# Patient Record
Sex: Male | Born: 1980 | Race: White | Hispanic: No | Marital: Single | Smoking: Never smoker
Health system: Southern US, Community
[De-identification: ages and names within clinical notes are randomized; demographics above are authoritative.]

## PROBLEM LIST (undated history)

## (undated) DIAGNOSIS — I10 Essential (primary) hypertension: Secondary | ICD-10-CM

---

## 2012-09-15 ENCOUNTER — Ambulatory Visit (INDEPENDENT_AMBULATORY_CARE_PROVIDER_SITE_OTHER): Payer: 59

## 2012-09-15 ENCOUNTER — Ambulatory Visit (INDEPENDENT_AMBULATORY_CARE_PROVIDER_SITE_OTHER): Payer: 59 | Admitting: Internal Medicine

## 2012-09-15 ENCOUNTER — Encounter: Payer: Self-pay | Admitting: Internal Medicine

## 2012-09-15 VITALS — BP 126/84 | HR 65 | Temp 98.3°F | Wt 176.0 lb

## 2012-09-15 DIAGNOSIS — Z13220 Encounter for screening for lipoid disorders: Secondary | ICD-10-CM

## 2012-09-15 DIAGNOSIS — Z130 Encounter for screening for diseases of the blood and blood-forming organs and certain disorders involving the immune mechanism: Secondary | ICD-10-CM

## 2012-09-15 DIAGNOSIS — Z131 Encounter for screening for diabetes mellitus: Secondary | ICD-10-CM

## 2012-09-15 DIAGNOSIS — Z0000 Encounter for general adult medical examination without abnormal findings: Secondary | ICD-10-CM

## 2012-09-15 DIAGNOSIS — R7989 Other specified abnormal findings of blood chemistry: Secondary | ICD-10-CM

## 2012-09-15 DIAGNOSIS — D234 Other benign neoplasm of skin of scalp and neck: Secondary | ICD-10-CM

## 2012-09-15 LAB — CBC
Hemoglobin: 16.7 g/dL (ref 13.0–17.0)
MCHC: 34.5 g/dL (ref 30.0–36.0)
RDW: 12.9 % (ref 11.5–14.6)
WBC: 8 10*3/uL (ref 4.5–10.5)

## 2012-09-15 LAB — HEMOGLOBIN A1C: Hgb A1c MFr Bld: 5.3 % (ref 4.6–6.5)

## 2012-09-15 NOTE — Progress Notes (Signed)
HPI  Pt presents to the clinic today to establish care. He does not have a prior PCP. He does have some concern today about a cyst that has been on his head for the last 1-2 years. It does not hurt. It does not seem to be growing. He is ready to have it taken out.  Flu: yearly Tetanus: UTD, but does not know year Dentist: as needed  History reviewed. No pertinent past medical history.  No current outpatient prescriptions on file.   No current facility-administered medications for this visit.    Not on File  History reviewed. No pertinent family history.  History   Social History  . Marital Status: Single    Spouse Name: N/A    Number of Children: N/A  . Years of Education: N/A   Occupational History  . Not on file.   Social History Main Topics  . Smoking status: Not on file  . Smokeless tobacco: Not on file  . Alcohol Use: Not on file  . Drug Use: Not on file  . Sexual Activity: Not on file   Other Topics Concern  . Not on file   Social History Narrative  . No narrative on file    ROS:  Constitutional: Denies fever, malaise, fatigue, headache or abrupt weight changes.  HEENT: Denies eye pain, eye redness, ear pain, ringing in the ears, wax buildup, runny nose, nasal congestion, bloody nose, or sore throat. Respiratory: Denies difficulty breathing, shortness of breath, cough or sputum production.   Cardiovascular: Denies chest pain, chest tightness, palpitations or swelling in the hands or feet.  Gastrointestinal: Denies abdominal pain, bloating, constipation, diarrhea or blood in the stool.  GU: Denies frequency, urgency, pain with urination, blood in urine, odor or discharge. Musculoskeletal: Denies decrease in range of motion, difficulty with gait, muscle pain or joint pain and swelling.  Skin: Pt reports cyst on top of head. Denies redness, rashes, lesions or ulcercations.  Neurological: Denies dizziness, difficulty with memory, difficulty with speech or  problems with balance and coordination.   No other specific complaints in a complete review of systems (except as listed in HPI above).  PE:  BP 126/84  Pulse 65  Temp(Src) 98.3 F (36.8 C) (Oral)  Wt 176 lb (79.833 kg)  SpO2 96% Wt Readings from Last 3 Encounters:  09/15/12 176 lb (79.833 kg)    General: Appears his stated age, well developed, well nourished in NAD. Skin: small dime sized cyst on top of heat, no erythema, tenderness or drainage. HEENT: Head: normal shape and size; Eyes: sclera white, no icterus, conjunctiva pink, PERRLA and EOMs intact; Ears: Tm's gray and intact, normal light reflex; Nose: mucosa pink and moist, septum midline; Throat/Mouth: Teeth present, mucosa pink and moist, no lesions or ulcerations noted.  Neck: Normal range of motion. Neck supple, trachea midline. No massses, lumps or thyromegaly present.  Cardiovascular: Normal rate and rhythm. S1,S2 noted.  No murmur, rubs or gallops noted. No JVD or BLE edema. No carotid bruits noted. Pulmonary/Chest: Normal effort and positive vesicular breath sounds. No respiratory distress. No wheezes, rales or ronchi noted.  Abdomen: Soft and nontender. Normal bowel sounds, no bruits noted. No distention or masses noted. Liver, spleen and kidneys non palpable. Musculoskeletal: Normal range of motion. No signs of joint swelling. No difficulty with gait.  Neurological: Alert and oriented. Cranial nerves II-XII intact. Coordination normal. +DTRs bilaterally. Psychiatric: Mood and affect normal. Behavior is normal. Judgment and thought content normal.  Assessment and Plan:  Health Maintenance:  All HM UTD Encouraged pt to visit dentist yearly Will check screening labs today

## 2012-09-15 NOTE — Patient Instructions (Signed)
Health Maintenance, Males A healthy lifestyle and preventative care can promote health and wellness.  Maintain regular health, dental, and eye exams.  Eat a healthy diet. Foods like vegetables, fruits, whole grains, low-fat dairy products, and lean protein foods contain the nutrients you need without too many calories. Decrease your intake of foods high in solid fats, added sugars, and salt. Get information about a proper diet from your caregiver, if necessary.  Regular physical exercise is one of the most important things you can do for your health. Most adults should get at least 150 minutes of moderate-intensity exercise (any activity that increases your heart rate and causes you to sweat) each week. In addition, most adults need muscle-strengthening exercises on 2 or more days a week.   Maintain a healthy weight. The body mass index (BMI) is a screening tool to identify possible weight problems. It provides an estimate of body fat based on height and weight. Your caregiver can help determine your BMI, and can help you achieve or maintain a healthy weight. For adults 20 years and older:  A BMI below 18.5 is considered underweight.  A BMI of 18.5 to 24.9 is normal.  A BMI of 25 to 29.9 is considered overweight.  A BMI of 30 and above is considered obese.  Maintain normal blood lipids and cholesterol by exercising and minimizing your intake of saturated fat. Eat a balanced diet with plenty of fruits and vegetables. Blood tests for lipids and cholesterol should begin at age 20 and be repeated every 5 years. If your lipid or cholesterol levels are high, you are over 50, or you are a high risk for heart disease, you may need your cholesterol levels checked more frequently.Ongoing high lipid and cholesterol levels should be treated with medicines, if diet and exercise are not effective.  If you smoke, find out from your caregiver how to quit. If you do not use tobacco, do not start.  If you  choose to drink alcohol, do not exceed 2 drinks per day. One drink is considered to be 12 ounces (355 mL) of beer, 5 ounces (148 mL) of wine, or 1.5 ounces (44 mL) of liquor.  Avoid use of street drugs. Do not share needles with anyone. Ask for help if you need support or instructions about stopping the use of drugs.  High blood pressure causes heart disease and increases the risk of stroke. Blood pressure should be checked at least every 1 to 2 years. Ongoing high blood pressure should be treated with medicines if weight loss and exercise are not effective.  If you are 45 to 32 years old, ask your caregiver if you should take aspirin to prevent heart disease.  Diabetes screening involves taking a blood sample to check your fasting blood sugar level. This should be done once every 3 years, after age 45, if you are within normal weight and without risk factors for diabetes. Testing should be considered at a younger age or be carried out more frequently if you are overweight and have at least 1 risk factor for diabetes.  Colorectal cancer can be detected and often prevented. Most routine colorectal cancer screening begins at the age of 50 and continues through age 75. However, your caregiver may recommend screening at an earlier age if you have risk factors for colon cancer. On a yearly basis, your caregiver may provide home test kits to check for hidden blood in the stool. Use of a small camera at the end of a tube,   to directly examine the colon (sigmoidoscopy or colonoscopy), can detect the earliest forms of colorectal cancer. Talk to your caregiver about this at age 50, when routine screening begins. Direct examination of the colon should be repeated every 5 to 10 years through age 75, unless early forms of pre-cancerous polyps or small growths are found.  Hepatitis C blood testing is recommended for all people born from 1945 through 1965 and any individual with known risks for hepatitis C.  Healthy  men should no longer receive prostate-specific antigen (PSA) blood tests as part of routine cancer screening. Consult with your caregiver about prostate cancer screening.  Testicular cancer screening is not recommended for adolescents or adult males who have no symptoms. Screening includes self-exam, caregiver exam, and other screening tests. Consult with your caregiver about any symptoms you have or any concerns you have about testicular cancer.  Practice safe sex. Use condoms and avoid high-risk sexual practices to reduce the spread of sexually transmitted infections (STIs).  Use sunscreen with a sun protection factor (SPF) of 30 or greater. Apply sunscreen liberally and repeatedly throughout the day. You should seek shade when your shadow is shorter than you. Protect yourself by wearing long sleeves, pants, a wide-brimmed hat, and sunglasses year round, whenever you are outdoors.  Notify your caregiver of new moles or changes in moles, especially if there is a change in shape or color. Also notify your caregiver if a mole is larger than the size of a pencil eraser.  A one-time screening for abdominal aortic aneurysm (AAA) and surgical repair of large AAAs by sound wave imaging (ultrasonography) is recommended for ages 65 to 75 years who are current or former smokers.  Stay current with your immunizations. Document Released: 07/05/2007 Document Revised: 03/31/2011 Document Reviewed: 06/03/2010 [employer] Patient Information 2014 [employer].  

## 2012-09-16 LAB — COMPREHENSIVE METABOLIC PANEL
ALT: 38 U/L (ref 0–53)
AST: 25 U/L (ref 0–37)
Albumin: 4.8 g/dL (ref 3.5–5.2)
CO2: 31 mEq/L (ref 19–32)
Calcium: 9.7 mg/dL (ref 8.4–10.5)
Chloride: 99 mEq/L (ref 96–112)
GFR: 87.7 mL/min (ref >60.00)
Potassium: 4.1 mEq/L (ref 3.5–5.1)
Sodium: 137 mEq/L (ref 135–145)
Total Protein: 8.3 g/dL (ref 6.0–8.3)

## 2012-09-16 LAB — LIPID PANEL: HDL: 39 mg/dL — ABNORMAL LOW (ref >39.00)

## 2012-09-17 ENCOUNTER — Telehealth: Payer: Self-pay

## 2012-09-17 LAB — LDL CHOLESTEROL, DIRECT: Direct LDL: 170.5 mg/dL

## 2012-09-17 NOTE — Telephone Encounter (Signed)
Could not get through to patient's number listed so mailed his results with Regina's comments.

## 2012-09-17 NOTE — Telephone Encounter (Signed)
Message copied by Noreene Larsson on Fri Sep 17, 2012 11:49 AM ------      Message from: Lorre Munroe      Created: Fri Sep 17, 2012 11:31 AM       Please call pt and let him know his cholesterol and triglycerides are very elevated. He needs to try 3 months of lifestyle therapy which includes avoiding fried and fatty foods in addition to aerobic exercise 3 days a week. Make a fu appt for 3 months to recheck lipids. If still elevated at that time, we will need to start cholesterol medication. All other labs are normal. ------

## 2012-09-24 ENCOUNTER — Ambulatory Visit (INDEPENDENT_AMBULATORY_CARE_PROVIDER_SITE_OTHER): Payer: 59 | Admitting: Surgery

## 2012-10-04 ENCOUNTER — Encounter (INDEPENDENT_AMBULATORY_CARE_PROVIDER_SITE_OTHER): Payer: Self-pay | Admitting: Surgery

## 2012-10-04 ENCOUNTER — Ambulatory Visit (INDEPENDENT_AMBULATORY_CARE_PROVIDER_SITE_OTHER): Payer: Commercial Managed Care - PPO | Admitting: Surgery

## 2012-10-04 ENCOUNTER — Telehealth (INDEPENDENT_AMBULATORY_CARE_PROVIDER_SITE_OTHER): Payer: Self-pay | Admitting: Surgery

## 2012-10-04 VITALS — BP 144/68 | HR 84 | Temp 98.2°F | Resp 16 | Ht 69.0 in | Wt 175.0 lb

## 2012-10-04 DIAGNOSIS — L723 Sebaceous cyst: Secondary | ICD-10-CM

## 2012-10-04 NOTE — Patient Instructions (Addendum)

## 2012-10-04 NOTE — Progress Notes (Signed)
Patient ID: Willie [name], male   DOB: Jul 13, 1980, 32 y.o.   MRN: [id number]  Chief Complaint  Patient presents with  . New Evaluation    eval cyst on scalp    HPI Willie [name] is a 32 y.o. male.  Patient sent at the request of Nicki Reaper NP 4 cyst on scalp. It has been present for 2 years. He is getting larger. Causes mild discomfort when squeezed. No redness or drainage. HPI  History reviewed. No pertinent past medical history.  History reviewed. No pertinent past surgical history.  Family History  Problem Relation Age of Onset  . Diabetes Father   . Cancer Neg Hx   . Heart disease Neg Hx   . Stroke Neg Hx     Social History History  Substance Use Topics  . Smoking status: Never Smoker   . Smokeless tobacco: Never Used  . Alcohol Use: 0.6 oz/week    1 Shots of liquor per week     Comment: rare    No Known Allergies  No current outpatient prescriptions on file.   No current facility-administered medications for this visit.    Review of Systems Review of Systems  Constitutional: Negative for fever, chills and unexpected weight change.  HENT: Negative for hearing loss, congestion, sore throat, trouble swallowing and voice change.   Eyes: Negative for visual disturbance.  Respiratory: Negative for cough and wheezing.   Cardiovascular: Negative for chest pain, palpitations and leg swelling.  Gastrointestinal: Negative for nausea, vomiting, abdominal pain, diarrhea, constipation, blood in stool, abdominal distention, anal bleeding and rectal pain.  Genitourinary: Negative for hematuria and difficulty urinating.  Musculoskeletal: Negative for arthralgias.  Skin: Negative for rash and wound.  Neurological: Negative for seizures, syncope, weakness and headaches.  Hematological: Negative for adenopathy. Does not bruise/bleed easily.  Psychiatric/Behavioral: Negative for confusion.    Blood pressure 144/68, pulse 84, temperature 98.2 F (36.8 C), temperature  source Oral, resp. rate 16, height 5\' 9"  (1.753 m), weight 175 lb (79.379 kg).  Physical Exam Physical Exam  Constitutional: He is oriented to person, place, and time. He appears well-developed and well-nourished.  HENT:  Head: Atraumatic.    Eyes: EOM are normal. Pupils are equal, round, and reactive to light.  Neck: Normal range of motion. Neck supple.  Cardiovascular: Normal rate and regular rhythm.   Musculoskeletal: Normal range of motion.  Neurological: He is alert and oriented to person, place, and time.  Skin: Skin is warm and dry.  Psychiatric: He has a normal mood and affect. His behavior is normal. Judgment and thought content normal.      Assessment    2 cm x 2 cm sebaceous cyst anterior scalp    Plan    Patient wishes to proceed with excision of epidermal inclusion cyst.The procedure has been discussed with the patient.  Alternative therapies have been discussed with the patient.  Operative risks include bleeding,  Infection,  Organ injury,  Nerve injury,  Blood vessel injury,  DVT,  Pulmonary embolism,  Death,  And possible reoperation.  Medical management risks include worsening of present situation.  The success of the procedure is 50 -90 % at treating patients symptoms.  The patient understands and agrees to proceed.       Cale Decarolis A. 10/04/2012, 9:35 AM

## 2012-10-04 NOTE — Telephone Encounter (Signed)
Pt made aware of financial obligation will call back if and when for sx scheduling aware orders 90 days

## 2012-11-25 ENCOUNTER — Other Ambulatory Visit: Payer: Self-pay

## 2013-06-25 ENCOUNTER — Emergency Department (HOSPITAL_COMMUNITY)
Admission: EM | Admit: 2013-06-25 | Discharge: 2013-06-25 | Disposition: A | Discharge status: Home / Self Care | Payer: 59 | Source: Home / Self Care

## 2013-06-25 ENCOUNTER — Encounter (HOSPITAL_COMMUNITY): Payer: Self-pay | Admitting: Emergency Medicine

## 2013-06-25 DIAGNOSIS — J309 Allergic rhinitis, unspecified: Secondary | ICD-10-CM

## 2013-06-25 DIAGNOSIS — J040 Acute laryngitis: Secondary | ICD-10-CM

## 2013-06-25 DIAGNOSIS — R0982 Postnasal drip: Secondary | ICD-10-CM

## 2013-06-25 LAB — POCT RAPID STREP A: Streptococcus, Group A Screen (Direct): NEGATIVE

## 2013-06-25 MED ORDER — PREDNISONE 20 MG PO TABS
ORAL_TABLET | ORAL | Status: DC
Start: 2013-06-25 — End: 2013-07-05

## 2013-06-25 NOTE — Discharge Instructions (Signed)
Laryngitis Allegra 180 mg Lots of fluids flonase nasal spray At the top of your windpipe is your voice box. It is the source of your voice. Inside your voice box are 2 bands of muscles called vocal cords. When you breathe, your vocal cords are relaxed and open so that air can get into the lungs. When you decide to say something, these cords come together and vibrate. The sound from these vibrations goes into your throat and comes out through your mouth as sound. Laryngitis is an inflammation of the vocal cords that causes hoarseness, cough, loss of voice, sore throat, and dry throat. Laryngitis can be temporary (acute) or long-term (chronic). Most cases of acute laryngitis improve with time.Chronic laryngitis lasts for more than 3 weeks. CAUSES Laryngitis can often be related to excessive smoking, talking, or yelling, as well as inhalation of toxic fumes and allergies. Acute laryngitis is usually caused by a viral infection, vocal strain, measles or mumps, or bacterial infections. Chronic laryngitis is usually caused by vocal cord strain, vocal cord injury, postnasal drip, growths on the vocal cords, or acid reflux. SYMPTOMS   Cough.  Sore throat.  Dry throat. RISK FACTORS  Respiratory infections.  Exposure to irritating substances, such as cigarette smoke, excessive amounts of alcohol, stomach acids, and workplace chemicals.  Voice trauma, such as vocal cord injury from shouting or speaking too loud. DIAGNOSIS  Your cargiver will perform a physical exam. During the physical exam, your caregiver will examine your throat. The most common sign of laryngitis is hoarseness. Laryngoscopy may be necessary to confirm the diagnosis of this condition. This procedure allows your caregiver to look into the larynx. HOME CARE INSTRUCTIONS  Drink enough fluids to keep your urine clear or pale yellow.  Rest until you no longer have symptoms or as directed by your caregiver.  Breathe in moist  air.  Take all medicine as directed by your caregiver.  Do not smoke.  Talk as little as possible (this includes whispering).  Write on paper instead of talking until your voice is back to normal.  Follow up with your caregiver if your condition has not improved after 10 days. SEEK MEDICAL CARE IF:   You have trouble breathing.  You cough up blood.  You have persistent fever.  You have increasing pain.  You have difficulty swallowing. MAKE SURE YOU:  Understand these instructions.  Will watch your condition.  Will get help right away if you are not doing well or get worse. Document Released: 01/06/2005 Document Revised: 03/31/2011 Document Reviewed: 03/14/2010 [facility] Patient Information 2014 [place], [place].

## 2013-06-25 NOTE — ED Notes (Signed)
Call back number for lab issues verified 

## 2013-06-25 NOTE — ED Provider Notes (Signed)
CSN: [id number]     Arrival date & time 06/25/13  1804 History   First MD Initiated Contact with Patient 06/25/13 1831     Chief Complaint  Patient presents with  . Hoarse   (Consider location/radiation/quality/duration/timing/severity/associated sxs/prior Treatment) HPI Comments: 33 year old male complaining of laryngitis for 2 days.  Recent history of PND.   History reviewed. No pertinent past medical history. History reviewed. No pertinent past surgical history. Family History  Problem Relation Age of Onset  . Diabetes Father   . Cancer Neg Hx   . Heart disease Neg Hx   . Stroke Neg Hx    History  Substance Use Topics  . Smoking status: Never Smoker   . Smokeless tobacco: Never Used  . Alcohol Use: 0.6 oz/week    1 Shots of liquor per week     Comment: rare    Review of Systems  Constitutional: Negative for fever, activity change and fatigue.  HENT: Positive for postnasal drip, rhinorrhea and voice change. Negative for congestion and sore throat.   Eyes: Negative.   Respiratory: Negative for shortness of breath and wheezing.   Gastrointestinal: Negative.     Allergies  Review of patient's allergies indicates no known allergies.  Home Medications   Prior to Admission medications   Medication Sig Start Date End Date Taking? Authorizing Provider  predniSONE (DELTASONE) 20 MG tablet 2 tabs po once daily x 5 days 06/25/13   Janne Napoleon, NP   BP 163/97  Pulse 90  Temp(Src) 99.3 F (37.4 C) (Oral)  Resp 16  SpO2 97% Physical Exam  Nursing note and vitals reviewed. Constitutional: He is oriented to person, place, and time. He appears well-developed and well-nourished. No distress.  HENT:  Mouth/Throat: No oropharyngeal exudate.  Bilateral TMs are normal Oropharynx with mild erythema, cobblestoning and clear PND  Eyes: Conjunctivae and EOM are normal.  Neck: Normal range of motion. Neck supple.  Cardiovascular: Normal rate and normal heart sounds.    Pulmonary/Chest: Effort normal and breath sounds normal. No respiratory distress.  Lymphadenopathy:    He has no cervical adenopathy.  Neurological: He is alert and oriented to person, place, and time.  Skin: Skin is warm and dry. He is not diaphoretic.    ED Course  Procedures (including critical care time) Labs Review Labs Reviewed  POCT RAPID STREP A ([facility]RG CARE ONLY)    Imaging Review No results found.   MDM   1. Laryngitis   2. PND (post-nasal drip)   3. Allergic rhinitis      Prednisone, low dose Allegra Fluids flonase    Janne Napoleon, NP 06/25/13 2009

## 2013-06-25 NOTE — ED Notes (Signed)
C/o hoarse x couple of days, lost voice. hallitosis

## 2013-06-26 NOTE — ED Provider Notes (Signed)
Medical screening examination/treatment/procedure(s) were performed by a resident physician or non-physician practitioner and as the supervising physician I was immediately available for consultation/collaboration.  Lynne Leader, MD    Gregor Hams, MD 06/26/13 [phone]

## 2013-06-27 LAB — CULTURE, GROUP A STREP

## 2013-07-05 ENCOUNTER — Encounter: Payer: Self-pay | Admitting: Internal Medicine

## 2013-07-05 ENCOUNTER — Ambulatory Visit (INDEPENDENT_AMBULATORY_CARE_PROVIDER_SITE_OTHER): Payer: 59 | Admitting: Internal Medicine

## 2013-07-05 VITALS — BP 148/98 | HR 86 | Temp 98.5°F | Wt 180.0 lb

## 2013-07-05 DIAGNOSIS — J040 Acute laryngitis: Secondary | ICD-10-CM

## 2013-07-05 MED ORDER — OMEPRAZOLE 20 MG PO CPDR: 20.0000 mg | DELAYED_RELEASE_CAPSULE | Freq: Two times a day (BID) | ORAL | Status: DC

## 2013-07-05 NOTE — Progress Notes (Signed)
   Subjective:    Patient ID: Willie [name], male    DOB: [birth year], 33 y.o.   MRN: [id number]  HPI  He's had laryngitis for 2 weeks. He was seen in the urgent care and prescribed prednisone 20 mg twice a day for 5 days.  After the prednisone there was some improvement over several days but the symptoms have recurred  He also used over-the-counter cough drops, Chloraseptic gargle,, DayQuil, NyQuil, Allegra sinus, and Afrin with marginal benefit  He describes a scratchy, irritated throat.  He works @ a [employer] which requires excess voice use.  He describes some watery eyes and postnasal drainage.   Review of Systems  He denies frontal headache, facial pain, dental pain, sore throat, or otic pain.  Cough is dry and not associated with wheezing or shortness of breath  No fever, chills, or sweats.        Objective:   Physical Exam He is markedly hoarse.  There is erythema and mild edema of the uvula.  Wax is noted in the right otic canal.  General appearance:good health ;well nourished; no acute distress or increased work of breathing is present.  No  lymphadenopathy about the head, neck, or axilla noted.   Eyes: No conjunctival inflammation or lid edema is present. There is no scleral icterus.  Ears:  External ear exam shows no significant lesions or deformities.    Nose:  External nasal examination shows no deformity or inflammation. Nasal mucosa are pink and moist without lesions or exudates. No septal dislocation or deviation.No obstruction to airflow.   Oral exam: Dental hygiene is good; lips and gums are healthy appearing.There is no oropharyngeal exudate noted.   Neck:  No deformities, thyromegaly, masses, or tenderness noted.   Supple with full range of motion without pain.   Heart:  Normal rate and regular rhythm. S1 and S2 normal without gallop, murmur, click, rub or other extra sounds.   Lungs:Chest clear to auscultation; no wheezes, rhonchi,rales ,or  rubs present.No increased work of breathing.    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

## 2013-07-05 NOTE — Progress Notes (Signed)
Pre visit review using our clinic review tool, if applicable. No additional management support is needed unless otherwise documented below in the visit note. 

## 2013-07-05 NOTE — Patient Instructions (Signed)
Plain Mucinex (NOT D) for thick secretions ;force NON dairy fluids .   Nasal cleansing in the shower as discussed with lather of mild shampoo.After 10 seconds wash off lather while  exhaling through nostrils. Make sure that all residual soap is removed to prevent irritation.  Flonase OR Nasacort AQ 1 spray in each nostril twice a day as needed. Use the "crossover" technique into opposite nostril spraying toward opposite ear @ 45 degree angle, not straight up into nostril.  Use a Neti pot daily only  as needed for significant sinus congestion; going from open side to congested side . Plain Allegra (NOT D )  160 daily , Loratidine 10 mg , OR Zyrtec 10 mg @ bedtime  as needed for itchy eyes & sneezing.  Reflux of gastric acid may be asymptomatic as this may occur mainly during sleep.The triggers for reflux  include stress; the "aspirin family" ; alcohol; peppermint; and caffeine (coffee, tea, cola, and chocolate). The aspirin family would include aspirin and the nonsteroidal agents such as ibuprofen &  Naproxen. Tylenol would not cause reflux. If having symptoms ; food & drink should be avoided for @ least 2 hours before going to bed.

## 2013-07-06 ENCOUNTER — Other Ambulatory Visit: Payer: Self-pay | Admitting: Internal Medicine

## 2013-07-06 ENCOUNTER — Encounter: Payer: Self-pay | Admitting: Internal Medicine

## 2013-07-06 MED ORDER — PREDNISONE 20 MG PO TABS: 20.0000 mg | ORAL_TABLET | Freq: Two times a day (BID) | ORAL | Status: DC

## 2014-02-28 ENCOUNTER — Ambulatory Visit (INDEPENDENT_AMBULATORY_CARE_PROVIDER_SITE_OTHER): Payer: 59 | Admitting: Internal Medicine

## 2014-02-28 ENCOUNTER — Encounter: Payer: Self-pay | Admitting: Internal Medicine

## 2014-02-28 VITALS — BP 150/90 | HR 89 | Temp 98.2°F | Ht 68.0 in | Wt 177.2 lb

## 2014-02-28 DIAGNOSIS — J069 Acute upper respiratory infection, unspecified: Secondary | ICD-10-CM

## 2014-02-28 DIAGNOSIS — J040 Acute laryngitis: Secondary | ICD-10-CM

## 2014-02-28 DIAGNOSIS — R059 Cough, unspecified: Secondary | ICD-10-CM

## 2014-02-28 DIAGNOSIS — R05 Cough: Secondary | ICD-10-CM

## 2014-02-28 MED ORDER — AZITHROMYCIN 250 MG PO TABS: ORAL_TABLET | ORAL | Status: DC

## 2014-02-28 MED ORDER — HYDROCODONE-HOMATROPINE 5-1.5 MG/5ML PO SYRP: 5.0000 mL | ORAL_SOLUTION | Freq: Four times a day (QID) | ORAL | Status: DC | PRN

## 2014-02-28 MED ORDER — FLUTICASONE PROPIONATE 50 MCG/ACT NA SUSP: 1.0000 | Freq: Two times a day (BID) | NASAL | Status: DC | PRN

## 2014-02-28 MED ORDER — PREDNISONE 20 MG PO TABS: 20.0000 mg | ORAL_TABLET | Freq: Two times a day (BID) | ORAL | Status: DC

## 2014-02-28 NOTE — Progress Notes (Signed)
   Subjective:    Patient ID: Willie [name], male    DOB: [birth year], 34 y.o.   MRN: [id number]  HPI Symptoms began 02/20/14 as cold chills and weakness. Subsequently he's had laryngitis which has waxed and waned in intensity. He continues to have irritation sensation in the lower throat.  He did have some discolored nasal secretions up until 02/26/14. He continues to have the dry cough.  He's use Tylenol, NyQuil, DayQuil, and cough drops with variable response.(Note: see BP & pulse in context of these OTC meds) Flu shot is up-to-date. 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  Review of Systems He denies frontal headache, facial pain, dental pain, otic discharge, wheezing, or shortness of breath.  He has no extrinsic symptoms of itchy, watery eyes, sneezing.    Objective:   Physical Exam  Positive or pertinent findings include: Left nare is boggy. There is suggestion of a serous otitis on the left with diffuse light reflex. He is markedly hoarse.  General appearance:Adequately nourished; no acute distress or increased work of breathing is present.  No  lymphadenopathy about the head, neck, or axilla noted.  Eyes: No conjunctival inflammation or lid edema is present. There is no scleral icterus. Ears:  External ear exam shows no significant lesions or deformities.  Otoscopic examination reveals clear canals, tympanic membranes are intact bilaterally without bulging, retraction, inflammation or discharge. Nose:  External nasal examination shows no deformity or inflammation.  No septal dislocation or deviation.Some obstruction to airflow on L.  Oral exam: Dental hygiene is good; lips and gums are healthy appearing.There is mild oropharyngeal erythema ;no exudate noted.  Neck:  No deformities, thyromegaly, masses, or tenderness noted.   Supple with full range of motion without pain.   Heart:  Normal rate and regular rhythm. S1 and S2 normal without gallop,  murmur, click, rub or other extra sounds.  Lungs:Chest clear to auscultation; no wheezes, rhonchi,rales ,or rubs present. Extremities:  No cyanosis, edema, or clubbing  noted  Skin: Warm & dry w/o jaundice or tenting.      Assessment & Plan:  #1 upper respiratory tract infection  #2 laryngitis  #3 cough probably related to postnasal drainage new progress plan: See orders and recommendations

## 2014-02-28 NOTE — Patient Instructions (Addendum)
Plain Mucinex (NOT D) for thick secretions ;force NON dairy fluids .   Nasal cleansing in the shower as discussed with lather of mild shampoo.After 10 seconds wash off lather while  exhaling through nostrils. Make sure that all residual soap is removed to prevent irritation.  Flonase OR Nasacort AQ 1 spray in each nostril twice a day as needed. Use the "crossover" technique into opposite nostril spraying toward opposite ear @ 45 degree angle, not straight up into nostril.  Plain Allegra (NOT D )  160 daily , Loratidine 10 mg , OR Zyrtec 10 mg @ bedtime  as needed for itchy eyes & sneezing. Zicam Melts or Zinc lozenges as per package label for throat symptoms.  Fill the  prescription for prednisone it there is not dramatic improvement in the throat & L ear symptoms over the next 48 hours.

## 2014-02-28 NOTE — Progress Notes (Signed)
Pre visit review using our clinic review tool, if applicable. No additional management support is needed unless otherwise documented below in the visit note. 

## 2014-03-16 ENCOUNTER — Ambulatory Visit (INDEPENDENT_AMBULATORY_CARE_PROVIDER_SITE_OTHER): Payer: 59 | Admitting: Internal Medicine

## 2014-03-16 ENCOUNTER — Encounter: Payer: Self-pay | Admitting: Internal Medicine

## 2014-03-16 VITALS — BP 116/86 | HR 101 | Temp 98.0°F | Resp 18 | Ht 69.0 in | Wt 172.0 lb

## 2014-03-16 DIAGNOSIS — Z0000 Encounter for general adult medical examination without abnormal findings: Secondary | ICD-10-CM | POA: Insufficient documentation

## 2014-03-16 NOTE — Assessment & Plan Note (Signed)
Check fasting lipid panel, HIV negative 1 year ago. Declines tetanus shot, already had flu shot. Non-smoker and social drinker. No drugs. Does not exercise much and we discussed exercising about 3 times per week. Reminded to use seat belts.

## 2014-03-16 NOTE — Progress Notes (Signed)
   Subjective:    Patient ID: Willie [name], male    DOB: 18-Jul-1980, 34 y.o.   MRN: [id number]  HPI The patient is a 34 YO man who is coming in for wellness. He denies any current problems. He works in [employer], technical sales. He does not exercise much outside of work. He does have several lizards that he keeps. He had HIV test negative about 1 year ago and does not think he needs another right now. No concern for STD.  PMH, [facility], social history reviewed and updated.   Review of Systems  Constitutional: Negative for fever, chills, activity change, appetite change and unexpected weight change.  HENT: Negative.   Eyes: Negative.   Respiratory: Negative for cough, chest tightness and shortness of breath.   Cardiovascular: Negative for chest pain, palpitations and leg swelling.  Gastrointestinal: Negative for abdominal pain, diarrhea, constipation and abdominal distention.  Musculoskeletal: Negative.   Skin: Negative.   Neurological: Negative.   Psychiatric/Behavioral: Negative.       Objective:   Physical Exam  Constitutional: He is oriented to person, place, and time. He appears well-developed and well-nourished.  HENT:  Head: Normocephalic and atraumatic.  Eyes: EOM are normal.  Neck: Normal range of motion.  Cardiovascular: Normal rate and regular rhythm.   No murmur heard. Pulmonary/Chest: Effort normal and breath sounds normal. No respiratory distress. He has no wheezes. He has no rales.  Abdominal: Soft. Bowel sounds are normal. He exhibits no distension. There is no tenderness. There is no rebound.  Musculoskeletal: He exhibits no edema.  Neurological: He is alert and oriented to person, place, and time. Coordination normal.  Skin: Skin is warm and dry.  Psychiatric: He has a normal mood and affect.   Filed Vitals:   03/16/14 1558  BP: 116/86  Pulse: 101  Temp: 98 F (36.7 C)  TempSrc: Oral  Resp: 18  Height: 5\' 9"  (1.753 m)  Weight: 172 lb (78.019 kg)  SpO2: 96%      Assessment  & Plan:

## 2014-03-16 NOTE — Progress Notes (Signed)
Pre visit review using our clinic review tool, if applicable. No additional management support is needed unless otherwise documented below in the visit note. 

## 2014-03-16 NOTE — Patient Instructions (Signed)
We will check blood work for the cholesterol and you can get it done today. If you want to come back when you are fasting that may be a better idea so we can see it at its best.   If you have any problems or questions please feel free to call the office.   If you are doing well you can come back next year for your physical.   Health Maintenance A healthy lifestyle and preventative care can promote health and wellness.  Maintain regular health, dental, and eye exams.  Eat a healthy diet. Foods like vegetables, fruits, whole grains, low-fat dairy products, and lean protein foods contain the nutrients you need and are low in calories. Decrease your intake of foods high in solid fats, added sugars, and salt. Get information about a proper diet from your health care provider, if necessary.  Regular physical exercise is one of the most important things you can do for your health. Most adults should get at least 150 minutes of moderate-intensity exercise (any activity that increases your heart rate and causes you to sweat) each week. In addition, most adults need muscle-strengthening exercises on 2 or more days a week.   Maintain a healthy weight. The body mass index (BMI) is a screening tool to identify possible weight problems. It provides an estimate of body fat based on height and weight. Your health care provider can find your BMI and can help you achieve or maintain a healthy weight. For males 20 years and older:  A BMI below 18.5 is considered underweight.  A BMI of 18.5 to 24.9 is normal.  A BMI of 25 to 29.9 is considered overweight.  A BMI of 30 and above is considered obese.  Maintain normal blood lipids and cholesterol by exercising and minimizing your intake of saturated fat. Eat a balanced diet with plenty of fruits and vegetables. Blood tests for lipids and cholesterol should begin at age 36 and be repeated every 5 years. If your lipid or cholesterol levels are high, you are over  age 16, or you are at high risk for heart disease, you may need your cholesterol levels checked more frequently.Ongoing high lipid and cholesterol levels should be treated with medicines if diet and exercise are not working.  If you smoke, find out from your health care provider how to quit. If you do not use tobacco, do not start.  Lung cancer screening is recommended for adults aged 70-80 years who are at high risk for developing lung cancer because of a history of smoking. A yearly low-dose CT scan of the lungs is recommended for people who have at least a 30-pack-year history of smoking and are current smokers or have quit within the past 15 years. A pack year of smoking is smoking an average of 1 pack of cigarettes a day for 1 year (for example, a 30-pack-year history of smoking could mean smoking 1 pack a day for 30 years or 2 packs a day for 15 years). Yearly screening should continue until the smoker has stopped smoking for at least 15 years. Yearly screening should be stopped for people who develop a health problem that would prevent them from having lung cancer treatment.  If you choose to drink alcohol, do not have more than 2 drinks per day. One drink is considered to be 12 oz (360 mL) of beer, 5 oz (150 mL) of wine, or 1.5 oz (45 mL) of liquor.  Avoid the use of street drugs. Do  not share needles with anyone. Ask for help if you need support or instructions about stopping the use of drugs.  High blood pressure causes heart disease and increases the risk of stroke. Blood pressure should be checked at least every 1-2 years. Ongoing high blood pressure should be treated with medicines if weight loss and exercise are not effective.  If you are 83-50 years old, ask your health care provider if you should take aspirin to prevent heart disease.  Diabetes screening involves taking a blood sample to check your fasting blood sugar level. This should be done once every 3 years after age 31 if you  are at a normal weight and without risk factors for diabetes. Testing should be considered at a younger age or be carried out more frequently if you are overweight and have at least 1 risk factor for diabetes.  Colorectal cancer can be detected and often prevented. Most routine colorectal cancer screening begins at the age of 79 and continues through age 31. However, your health care provider may recommend screening at an earlier age if you have risk factors for colon cancer. On a yearly basis, your health care provider may provide home test kits to check for hidden blood in the stool. A small camera at the end of a tube may be used to directly examine the colon (sigmoidoscopy or colonoscopy) to detect the earliest forms of colorectal cancer. Talk to your health care provider about this at age 78 when routine screening begins. A direct exam of the colon should be repeated every 5-10 years through age 36, unless early forms of precancerous polyps or small growths are found.  People who are at an increased risk for hepatitis B should be screened for this virus. You are considered at high risk for hepatitis B if:  You were born in a country where hepatitis B occurs often. Talk with your health care provider about which countries are considered high risk.  Your parents were born in a high-risk country and you have not received a shot to protect against hepatitis B (hepatitis B vaccine).  You have HIV or AIDS.  You use needles to inject street drugs.  You live with, or have sex with, someone who has hepatitis B.  You are a man who has sex with other men (MSM).  You get hemodialysis treatment.  You take certain medicines for conditions like cancer, organ transplantation, and autoimmune conditions.  Hepatitis C blood testing is recommended for all people born from [birth year] through 1965 and any individual with known risk factors for hepatitis C.  Healthy men should no longer receive prostate-specific  antigen (PSA) blood tests as part of routine cancer screening. Talk to your health care provider about prostate cancer screening.  Testicular cancer screening is not recommended for adolescents or adult males who have no symptoms. Screening includes self-exam, a health care provider exam, and other screening tests. Consult with your health care provider about any symptoms you have or any concerns you have about testicular cancer.  Practice safe sex. Use condoms and avoid high-risk sexual practices to reduce the spread of sexually transmitted infections (STIs).  You should be screened for STIs, including gonorrhea and chlamydia if:  You are sexually active and are younger than 24 years.  You are older than 24 years, and your health care provider tells you that you are at risk for this type of infection.  Your sexual activity has changed since you were last screened, and you are at  an increased risk for chlamydia or gonorrhea. Ask your health care provider if you are at risk.  If you are at risk of being infected with HIV, it is recommended that you take a prescription medicine daily to prevent HIV infection. This is called pre-exposure prophylaxis (PrEP). You are considered at risk if:  You are a man who has sex with other men (MSM).  You are a heterosexual man who is sexually active with multiple partners.  You take drugs by injection.  You are sexually active with a partner who has HIV.  Talk with your health care provider about whether you are at high risk of being infected with HIV. If you choose to begin PrEP, you should first be tested for HIV. You should then be tested every 3 months for as long as you are taking PrEP.  Use sunscreen. Apply sunscreen liberally and repeatedly throughout the day. You should seek shade when your shadow is shorter than you. Protect yourself by wearing long sleeves, pants, a wide-brimmed hat, and sunglasses year round whenever you are outdoors.  Tell  your health care provider of new moles or changes in moles, especially if there is a change in shape or color. Also, tell your health care provider if a mole is larger than the size of a pencil eraser.  A one-time screening for abdominal aortic aneurysm (AAA) and surgical repair of large AAAs by ultrasound is recommended for men aged 27-75 years who are current or former smokers.  Stay current with your vaccines (immunizations). Document Released: 07/05/2007 Document Revised: 01/11/2013 Document Reviewed: 06/03/2010 [facility] Patient Information 2015 [place], [place]. This information is not intended to replace advice given to you by your health care provider. Make sure you discuss any questions you have with your health care provider.

## 2014-10-11 ENCOUNTER — Encounter: Payer: Self-pay | Admitting: Internal Medicine

## 2014-10-11 ENCOUNTER — Encounter: Payer: Self-pay | Admitting: Emergency Medicine

## 2014-10-11 ENCOUNTER — Ambulatory Visit (INDEPENDENT_AMBULATORY_CARE_PROVIDER_SITE_OTHER): Payer: 59 | Admitting: Internal Medicine

## 2014-10-11 VITALS — BP 136/88 | HR 88 | Temp 98.3°F | Resp 16 | Wt 180.0 lb

## 2014-10-11 DIAGNOSIS — L259 Unspecified contact dermatitis, unspecified cause: Secondary | ICD-10-CM

## 2014-10-11 MED ORDER — HYDROXYZINE HCL 10 MG PO TABS: 10.0000 mg | ORAL_TABLET | Freq: Three times a day (TID) | ORAL | Status: DC | PRN

## 2014-10-11 MED ORDER — MOMETASONE FUROATE 0.1 % EX OINT: TOPICAL_OINTMENT | Freq: Two times a day (BID) | CUTANEOUS | Status: DC

## 2014-10-11 MED ORDER — PREDNISONE 10 MG PO TABS: ORAL_TABLET | ORAL | Status: DC

## 2014-10-11 NOTE — Progress Notes (Signed)
Pre visit review using our clinic review tool, if applicable. No additional management support is needed unless otherwise documented below in the visit note. 

## 2014-10-11 NOTE — Progress Notes (Signed)
   Subjective:    Patient ID: Willie [name], male    DOB: 1980/08/11, 34 y.o.   MRN: [id number]  HPI  He noted itching of the right forearm yesterday. After scratching he then noted some macular, erythematous lesions with some vesicle formation. There was no definite trigger except he did install gutters 10/08/14. The gutters were lying in the grass and he may have been exposed to poison ivy.  He has taken Benadryl which did not help the itching but decreased the swelling.  All drainage from the excoriated lesions has been clear.   Review of Systems  No associated itchy, watery eyes.  Swelling of the lips or tongue denied.  Shortness of breath, wheezing, or cough absent.  No rash or urticaria noted.  Fever ,chills , or sweats denied. Purulence absent.  Diarrhea not present.     Objective:   Physical Exam He has a small cystic lesion over the crown. Pattern alopecia is noted. He has a  beard.  There is a 15 x 15 mm raised, erythematous lesion over the right lateral wrist. Over the medial dorsal wrist there is a 15 x 10 mm macular lesion with multiple vesicles with clear drainage. There is 11 x 11 macular lesion in the intertriginous area at the base of the thumb and index finger. There are changes of remote grafting of the skin over the right forearm and hand. There is some visible swelling of the hand.  He has no lymphadenopathy about the epitrochlear, axillary, cervical lymph node chains.  General appearance:Adequately nourished; no acute distress or increased work of breathing is present.    Eyes: No conjunctival inflammation or lid edema is present. There is no scleral icterus.  Ears:  External ear exam shows no significant lesions or deformities.  Otoscopic examination reveals clear canals, tympanic membranes are intact bilaterally without bulging, retraction, inflammation or discharge.  Nose:  External nasal examination shows no deformity or inflammation. Nasal mucosa  are pink and moist without lesions or exudates.No septal dislocation or deviation.No obstruction to airflow.   Oral exam: Dental hygiene is good; lips and gums are healthy appearing.There is no oropharyngeal erythema or exudate .  Neck:  No deformities, thyromegaly, masses, or tenderness noted.   Supple with full range of motion without pain.   Heart:  Normal rate and regular rhythm. S1 and S2 normal without gallop, murmur, click, rub or other extra sounds.   Lungs:Chest clear to auscultation; no wheezes, rhonchi,rales ,or rubs present.  Extremities:  No cyanosis, edema, or clubbing  noted    Skin: Warm & dry w/o tenting or jaundice.     Assessment & Plan:  #1 contact dermatitis, probable poison ivy. Increased risk of secondary infection from scratching.  Plan: See orders &  recommendations

## 2014-10-11 NOTE — Patient Instructions (Signed)
Dip gauze in  sterile saline and applied to the lesionsd twice a day. Apply the steroid cream to the lesions after the sterile saline applications  without any bandage. The saline can be purchased at the drugstore or you can make your own .Boil cup of salt in a gallon of water. Store mixture  in a clean container.Report Warning  signs as discussed (red streaks, pus, fever, increasing pain). Keep the right hand elevated as much as possible over the next 18 hours.  Please report warning signs as we discussed. Worrisome would be red streaks up the extremity, increased pain, fever, or pus production.

## 2017-03-25 ENCOUNTER — Encounter: Payer: Self-pay | Admitting: Internal Medicine

## 2017-03-25 ENCOUNTER — Ambulatory Visit: Payer: 59 | Admitting: Internal Medicine

## 2017-03-25 DIAGNOSIS — M109 Gout, unspecified: Secondary | ICD-10-CM | POA: Insufficient documentation

## 2017-03-25 MED ORDER — PREDNISONE 20 MG PO TABS: 40.0000 mg | ORAL_TABLET | Freq: Every day | ORAL | 0 refills | Status: DC

## 2017-03-25 MED FILL — predniSONE 20 MG TABS: 20 | 5 days supply | Qty: 10 | Fill #0

## 2017-03-25 NOTE — Assessment & Plan Note (Signed)
Rx for prednisone for flare. Given dietary information and advised to follow up for physical in the next several months for uric acid level testing.

## 2017-03-25 NOTE — Patient Instructions (Signed)
We have sent in the prednisone to take. 2 pills daily for 5 days.  Low-Purine Diet Purines are compounds that affect the level of uric acid in your body. A low-purine diet is a diet that is low in purines. Eating a low-purine diet can prevent the level of uric acid in your body from getting too high and causing gout or kidney stones or both. What do I need to know about this diet?  Choose low-purine foods. Examples of low-purine foods are listed in the next section.  Drink plenty of fluids, especially water. Fluids can help remove uric acid from your body. Try to drink 8-16 cups (1.9-3.8 L) a day.  Limit foods high in fat, especially saturated fat, as fat makes it harder for the body to get rid of uric acid. Foods high in saturated fat include pizza, cheese, ice cream, whole milk, fried foods, and gravies. Choose foods that are lower in fat and lean sources of protein. Use olive oil when cooking as it contains healthy fats that are not high in saturated fat.  Limit alcohol. Alcohol interferes with the elimination of uric acid from your body. If you are having a gout attack, avoid all alcohol.  Keep in mind that different people's bodies react differently to different foods. You will probably learn over time which foods do or do not affect you. If you discover that a food tends to cause your gout to flare up, avoid eating that food. You can more freely enjoy foods that do not cause problems. If you have any questions about a food item, talk to your dietitian or health care provider. Which foods are low, moderate, and high in purines? The following is a list of foods that are low, moderate, and high in purines. You can eat any amount of the foods that are low in purines. You may be able to have small amounts of foods that are moderate in purines. Ask your health care provider how much of a food moderate in purines you can have. Avoid foods high in purines. Grains  Foods low in purines: Enriched  white bread, pasta, rice, cake, cornbread, popcorn.  Foods moderate in purines: Whole-grain breads and cereals, wheat germ, bran, oatmeal. Uncooked oatmeal. Dry wheat bran or wheat germ.  Foods high in purines: Pancakes, [place] toast, biscuits, muffins. Vegetables  Foods low in purines: All vegetables, except those that are moderate in purines.  Foods moderate in purines: Asparagus, cauliflower, spinach, mushrooms, green peas. Fruits  All fruits are low in purines. Meats and other Protein Foods  Foods low in purines: Eggs, nuts, peanut butter.  Foods moderate in purines: 80-90% lean beef, lamb, veal, pork, poultry, fish, eggs, peanut butter, nuts. Crab, lobster, oysters, and shrimp. Cooked dried beans, peas, and lentils.  Foods high in purines: Anchovies, sardines, herring, mussels, tuna, codfish, scallops, trout, and haddock. Berniece Salines. Organ meats (such as liver or kidney). Tripe. Game meat. Goose. Sweetbreads. Dairy  All dairy foods are low in purines. Low-fat and fat-free dairy products are best because they are low in saturated fat. Beverages  Drinks low in purines: Water, carbonated beverages, tea, coffee, cocoa.  Drinks moderate in purines: Soft drinks and other drinks sweetened with high-fructose corn syrup. Juices. To find whether a food or drink is sweetened with high-fructose corn syrup, look at the ingredients list.  Drinks high in purines: Alcoholic beverages (such as beer). Condiments  Foods low in purines: Salt, herbs, olives, pickles, relishes, vinegar.  Foods moderate in purines: Butter,  margarine, oils, mayonnaise. Fats and Oils  Foods low in purines: All types, except gravies and sauces made with meat.  Foods high in purines: Gravies and sauces made with meat. Other Foods  Foods low in purines: Sugars, sweets, gelatin. Cake. Soups made without meat.  Foods moderate in purines: Meat-based or fish-based soups, broths, or bouillons. Foods and drinks sweetened  with high-fructose corn syrup.  Foods high in purines: High-fat desserts (such as ice cream, cookies, cakes, pies, doughnuts, and chocolate). Contact your dietitian for more information on foods that are not listed here. This information is not intended to replace advice given to you by your health care provider. Make sure you discuss any questions you have with your health care provider. Document Released: 05/03/2010 Document Revised: 06/14/2015 Document Reviewed: 12/13/2012 Elsevier Interactive Patient Education  2017 [employer].

## 2017-03-25 NOTE — Progress Notes (Signed)
   Subjective:    Patient ID: Willie [name], male    DOB: 11-11-1980, 37 y.o.   MRN: [id number]  HPI The patient is a 37 YO man coming in for right great toe pain. He suspects that it is gout. He recalls having it once before about 5-6 years ago. Has had some changes in his diet lately with more sodas and proteins. He denies excessive alcohol. He did have redness and swelling around the right big toe about 1 week ago. It hurt even to touch clothes or covers. Started taking ibuprofen TID which provided some relief and elevating it when he could. This has gradually helped to reduce pain from 10/10 to 6/10 but it is still red and somewhat swollen.   Review of Systems  Constitutional: Positive for activity change. Negative for appetite change, chills, fatigue, fever and unexpected weight change.  HENT: Negative.   Respiratory: Negative for cough, chest tightness and shortness of breath.   Cardiovascular: Negative for chest pain, palpitations and leg swelling.  Gastrointestinal: Negative for abdominal distention, abdominal pain, constipation, diarrhea, nausea and vomiting.  Musculoskeletal: Positive for arthralgias, joint swelling and myalgias.  Skin: Negative.   Neurological: Negative.       Objective:   Physical Exam  Constitutional: He is oriented to person, place, and time. He appears well-developed and well-nourished.  HENT:  Head: Normocephalic and atraumatic.  Eyes: EOM are normal.  Neck: Normal range of motion.  Cardiovascular: Normal rate and regular rhythm.  Pulmonary/Chest: Effort normal and breath sounds normal. No respiratory distress. He has no wheezes. He has no rales.  Abdominal: Soft. Bowel sounds are normal. He exhibits no distension. There is no tenderness. There is no rebound.  Musculoskeletal: He exhibits edema and tenderness.  Redness and swelling and tenderness around the right great toe  Neurological: He is alert and oriented to person, place, and time.  Coordination normal.  Skin: Skin is warm and dry.   Vitals:   03/25/17 0835  BP: (!) 130/98  Pulse: 97  Temp: 98 F (36.7 C)  TempSrc: Oral  SpO2: 98%  Weight: 190 lb (86.2 kg)  Height: 5\' 9"  (1.753 m)      Assessment & Plan:

## 2018-05-27 ENCOUNTER — Emergency Department (HOSPITAL_COMMUNITY): Payer: 59

## 2018-05-27 ENCOUNTER — Encounter (HOSPITAL_COMMUNITY): Payer: Self-pay | Admitting: Emergency Medicine

## 2018-05-27 ENCOUNTER — Other Ambulatory Visit: Payer: Self-pay

## 2018-05-27 ENCOUNTER — Emergency Department (HOSPITAL_COMMUNITY)
Admission: EM | Admit: 2018-05-27 | Discharge: 2018-05-28 | Disposition: A | Discharge status: Home / Self Care | Payer: 59 | Attending: Emergency Medicine | Admitting: Emergency Medicine

## 2018-05-27 DIAGNOSIS — R0789 Other chest pain: Secondary | ICD-10-CM | POA: Diagnosis not present

## 2018-05-27 DIAGNOSIS — R0602 Shortness of breath: Secondary | ICD-10-CM

## 2018-05-27 DIAGNOSIS — I10 Essential (primary) hypertension: Secondary | ICD-10-CM

## 2018-05-27 DIAGNOSIS — R000 Tachycardia, unspecified: Secondary | ICD-10-CM | POA: Diagnosis not present

## 2018-05-27 LAB — COMPREHENSIVE METABOLIC PANEL
ALT: 33 U/L (ref 0–44)
AST: 28 U/L (ref 15–41)
Albumin: 5 g/dL (ref 3.5–5.0)
Alkaline Phosphatase: 76 U/L (ref 38–126)
Anion gap: 10 (ref 5–15)
BUN: 15 mg/dL (ref 6–20)
CO2: 26 mmol/L (ref 22–32)
Calcium: 9.3 mg/dL (ref 8.9–10.3)
Chloride: 104 mmol/L (ref 98–111)
Creatinine, Ser: 1.12 mg/dL (ref 0.61–1.24)
GFR calc Af Amer: >60 mL/min (ref >60)
GFR calc non Af Amer: >60 mL/min (ref >60)
Glucose, Bld: 115 mg/dL — ABNORMAL HIGH (ref 70–99)
Potassium: 3.8 mmol/L (ref 3.5–5.1)
Sodium: 140 mmol/L (ref 135–145)
Total Bilirubin: 1.5 mg/dL — ABNORMAL HIGH (ref 0.3–1.2)
Total Protein: 8.3 g/dL — ABNORMAL HIGH (ref 6.5–8.1)

## 2018-05-27 LAB — CBC WITH DIFFERENTIAL/PLATELET
Abs Immature Granulocytes: 0.02 10*3/uL (ref 0.00–0.07)
Basophils Absolute: 0 10*3/uL (ref 0.0–0.1)
Basophils Relative: 0 %
Eosinophils Absolute: 0.1 10*3/uL (ref 0.0–0.5)
Eosinophils Relative: 1 %
HCT: 46.4 % (ref 39.0–52.0)
Hemoglobin: 16 g/dL (ref 13.0–17.0)
Immature Granulocytes: 0 %
Lymphocytes Relative: 19 %
Lymphs Abs: 2 10*3/uL (ref 0.7–4.0)
MCH: 30 pg (ref 26.0–34.0)
MCHC: 34.5 g/dL (ref 30.0–36.0)
MCV: 87.1 fL (ref 80.0–100.0)
Monocytes Absolute: 0.6 10*3/uL (ref 0.1–1.0)
Monocytes Relative: 6 %
Neutro Abs: 7.9 10*3/uL — ABNORMAL HIGH (ref 1.7–7.7)
Neutrophils Relative %: 74 %
Platelets: 220 10*3/uL (ref 150–400)
RBC: 5.33 MIL/uL (ref 4.22–5.81)
RDW: 12.6 % (ref 11.5–15.5)
WBC: 10.7 10*3/uL — ABNORMAL HIGH (ref 4.0–10.5)
nRBC: 0 % (ref 0.0–0.2)

## 2018-05-27 LAB — TROPONIN I: Troponin I: <0.03 ng/mL (ref <0.03)

## 2018-05-27 MED ORDER — LISINOPRIL 10 MG PO TABS
10.0000 mg | ORAL_TABLET | Freq: Once | ORAL | Status: AC
  Administered 2018-05-27: 10 mg via ORAL
  Filled 2018-05-27: qty 1

## 2018-05-27 NOTE — ED Notes (Signed)
Checked Pulse Oximetry while ambulating pt. Pt O2 saturation remained at 100% on RA. HR was 120-125 bpm.

## 2018-05-27 NOTE — ED Notes (Signed)
Pt c/o chest pressure over the center and left side of the chest. Provider notified, will continue to monitor.

## 2018-05-27 NOTE — ED Triage Notes (Signed)
Patient c/o SOB x 2 days. Reports it worsens at rest and at night. Denies cough and fever. Denies N/V/D.

## 2018-05-27 NOTE — ED Provider Notes (Signed)
[facility] DEPT Provider Note   CSN: [id number] Arrival date & time: 05/27/18  2158    History   Chief Complaint Chief Complaint  Patient presents with  . Shortness of Breath    HPI [facility] Willie [name] is Willie [name] 38 y.o. male.     Patient with no significant past medical history presents to the emergency department today with several days of shortness of breath.  He describes this as Willie [name] feeling "like I just cannot get Willie [name] good breath".  He states that he notices this mainly at night and when he is at rest.  He actually feels better when he is up moving around or working.  He has had some very minimal chest pressure at times.  Pressure does not radiate.  He denies any associated nausea, vomiting, diaphoresis.  He denies any history of asthma, breathing problems.  Denies known history of hypertension, high cholesterol, diabetes.  He is not Willie [name] smoker.  Chest tightness is not made worse with exertion however patient does admit to being very sedentary.  He also states that he does not eat well and states he suspects this might be making his symptoms worse.  Patient with several first and second-degree relatives with heart disease in their 44s and 31s. Patient denies risk factors for pulmonary embolism including: unilateral leg swelling, history of DVT/PE/other blood clots, use of exogenous hormones, recent immobilizations, recent surgery, recent travel (>4hr segment), malignancy, hemoptysis.  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  He denies any known sick contacts or COVID-19 contacts.  [facility] [name] was evaluated in Emergency Department on 05/27/2018 for the symptoms described in the history of present illness. He was evaluated in the context of the global COVID-19 pandemic, which necessitated consideration that the patient might be at risk for infection with the SARS-CoV-2 virus that causes COVID-19.  Institutional protocols and algorithms that pertain to the evaluation of patients at risk for COVID-19 are in [facility] [name] of rapid change based on information released by regulatory bodies including the CDC and federal and state organizations. These policies and algorithms were followed during the patient's care in the ED.       History reviewed. No pertinent past medical history.  Patient Active Problem List   Diagnosis Date Noted  . Gout 03/25/2017  . Routine general medical examination at [name] health care facility 03/16/2014    History reviewed. No pertinent surgical history.      Home Medications    Prior to Admission medications   Medication Sig Start Date End Date Taking? Authorizing Provider  predniSONE (DELTASONE) 20 MG tablet Take 2 tablets (40 mg total) by mouth daily with breakfast. 03/25/17   Hoyt Koch, MD    Family History Family History  Problem Relation Age of Onset  . Diabetes Father   . Cancer Neg Hx   . Heart disease Neg Hx   . Stroke Neg Hx     Social History Social History   Tobacco Use  . Smoking status: Never Smoker  . Smokeless tobacco: Never Used  Substance Use Topics  . Alcohol use: Yes    Alcohol/week: 1.0 standard drinks    Types: 1 Shots of liquor per week    Comment: rare  . Drug use: No     Allergies   Patient has no known allergies.   Review of Systems Review of Systems  Constitutional: Negative for  diaphoresis and fever.  Eyes: Negative for redness.  Respiratory: Positive for chest tightness and shortness of breath. Negative for cough.   Cardiovascular: Negative for chest pain, palpitations and leg swelling.  Gastrointestinal: Negative for abdominal pain, nausea and vomiting.  Genitourinary: Negative for dysuria.  Musculoskeletal: Negative for back pain and neck pain.  Skin: Negative for rash.  Neurological: Negative for syncope and light-headedness.  Psychiatric/Behavioral: The patient is not nervous/anxious.       Physical Exam Updated Vital Signs BP (!) 196/108 (BP Location: Right Arm)   Pulse (!) 104   Resp 17   SpO2 99%   Physical Exam Vitals signs and nursing note reviewed.  Constitutional:      Appearance: He is well-developed. He is not diaphoretic.  HENT:     Head: Normocephalic and atraumatic.     Mouth/Throat:     Mouth: Mucous membranes are not dry.  Eyes:     Conjunctiva/sclera: Conjunctivae normal.  Neck:     Musculoskeletal: Normal range of motion and neck supple. No muscular tenderness.     Vascular: Normal carotid pulses. No carotid bruit or JVD.     Trachea: Trachea normal. No tracheal deviation.  Cardiovascular:     Rate and Rhythm: Regular rhythm. Tachycardia present.     Pulses: No decreased pulses.     Heart sounds: Normal heart sounds, S1 normal and S2 normal. Heart sounds not distant. No murmur.  Pulmonary:     Effort: Pulmonary effort is normal. No tachypnea or respiratory distress.     Breath sounds: Normal breath sounds. No wheezing.  Chest:     Chest wall: No tenderness.  Abdominal:     General: Bowel sounds are normal.     Palpations: Abdomen is soft.     Tenderness: There is no abdominal tenderness. There is no guarding or rebound.  Musculoskeletal:     Right lower leg: He exhibits no tenderness. No edema.     Left lower leg: He exhibits no tenderness. No edema.  Skin:    General: Skin is warm and dry.     Coloration: Skin is not pale.  Neurological:     Mental Status: He is alert.  Psychiatric:        Mood and Affect: Mood is anxious.      ED Treatments / Results  Labs (all labs ordered are listed, but only abnormal results are displayed) Labs Reviewed  CBC WITH DIFFERENTIAL/PLATELET - Abnormal; Notable for the following components:      Result Value   WBC 10.7 (*)    Neutro Abs 7.9 (*)    All other components within normal limits  COMPREHENSIVE METABOLIC PANEL - Abnormal; Notable for the following components:   Glucose, Bld 115 (*)     Total Protein 8.3 (*)    Total Bilirubin 1.5 (*)    All other components within normal limits  TROPONIN I  TSH  D-DIMER, QUANTITATIVE (NOT AT [facility])  TROPONIN I    Radiology Dg Chest Portable 1 View  Result Date: 05/27/2018 CLINICAL DATA:  Shortness of breath EXAM: PORTABLE CHEST 1 VIEW COMPARISON:  None. FINDINGS: Heart and mediastinal contours are within normal limits. No focal opacities or effusions. No acute bony abnormality. IMPRESSION: No active disease. Electronically Signed   By: Rolm Baptise M.D.   On: 05/27/2018 22:25    Procedures Procedures (including critical care time)  Medications Ordered in ED Medications  lisinopril (ZESTRIL) tablet 10 mg (10 mg Oral Given 05/27/18 2345)  Initial Impression / Assessment and Plan / ED Course  I have reviewed the triage vital signs and the nursing notes.  Pertinent labs & imaging results that were available during my care of the patient were reviewed by me and considered in my medical decision making (see chart for details).        Patient seen and examined.  Patient appears well, in no distress at time of initial exam.  Pulse oxygen level is 100% on room air.  He is not using any accessory muscles.  Will check chest x-ray, EKG, labs.  Vital signs reviewed and are as follows: BP (!) 196/108 (BP Location: Right Arm)   Pulse (!) 104   Resp 17   SpO2 99%   ED ECG REPORT   Date: 05/27/2018  Rate: 106  Rhythm: sinus tachycardia  QRS Axis: normal  Intervals: normal  ST/T Wave abnormalities: nonspecific T wave changes  Conduction Disutrbances:none  Narrative Interpretation: Inferolateral q-waves noted  Old EKG Reviewed: none available  I have personally reviewed the EKG tracing and agree with the computerized printout as noted.  11:40 PM patient has been ambulated with increase in heart rate to 120-125.  He maintains [name] normal pulse ox with exertion.  Patient continues to complain of some mild, intermittent, waxing and  waning episodes of mild chest pressure with associated palpitations.  In addition, patient's resting heart rate remains generally 105-115.  His blood pressures have remained greater than 030 systolic.  Given waxing and waning chest pressure, will ocardiogram and full evaluation of his cardiac risk factors.repeat troponin at 1:30 AM.  Given elevated heart rate and complaint of shortness of breath, will send TSH and d-dimer.  Patient states that last time he was at Willie [name] doctor, several years ago, he was told that his blood pressure was borderline.  Given EKG findings tonight and significantly elevated blood pressure, will start on 10 mg of lisinopril.  Patient will need to follow-up with cardiology and PCP after evaluation here tonight for continued management titration.  He will also need consideration for an echocardiogram and full evaluation of his cardiac risk factors.   1:42 AM Repeat EKG:  EKG Interpretation  Date/Time:  Friday May 28 2018 01:37:10 EDT Ventricular Rate:  98 PR Interval:    QRS Duration: 96 QT Interval:  340 QTC Calculation: 435 R Axis:   86 Text Interpretation:  Sinus rhythm Left ventricular hypertrophy When compared with ECG of 05/27/2018, No significant change was found Confirmed by Delora Fuel ([id number]) on 05/28/2018 1:41:08 AM      2:18 AM remainder of work-up is reassuring.  Patient updated on results.  We will have him follow-up with primary care as well as cardiologist to address his symptoms and remaining concerns regarding his EKG and hypertension.  Patient was counseled to return with severe chest pain, especially if the pain is crushing or pressure-like and spreads to the arms, back, neck, or jaw, or if they have sweating, nausea, or shortness of breath with the pain. They were encouraged to call 911 with these symptoms.   They were also told to return if their chest pain gets worse and does not go away with rest, they have an attack of chest pain lasting longer than  usual despite rest and treatment with the medications their caregiver has prescribed, if they wake from sleep with chest pain or shortness of breath, if they feel dizzy or faint, if they have chest pain not typical of their usual pain, or if  they have any other emergent concerns regarding their health.  The patient verbalized understanding and agreed.    Final Clinical Impressions(s) / ED Diagnoses   Final diagnoses:  Shortness of breath  Hypertension, unspecified type  Chest tightness  Tachycardia   Patient with take episodes of chest tightness and shortness of breath noted to have persistently elevated heart rate in the emergency department.  Patient does not see Willie [name] doctor regularly.  Evaluation tonight largely reassuring.  Troponin negative x2.  D-dimer is normal.  TSH is normal.  Chest x-ray without any abnormal findings.  EKG is abnormal with T wave inversions and signs of left ventricular hypertrophy.  His blood pressure has been elevated throughout his stay.  Normal kidney function.  Patient will be started on 10 mg of lisinopril daily and he will follow-up with PCP and cardiology referrals has noted.  Return instructions as above.  Do not suspect emergent etiology of chest pain.  Doubt MI, pericarditis or myocarditis, pulmonary embolism, pneumonia.   ED Discharge Orders         Ordered    lisinopril (ZESTRIL) 10 MG tablet  Daily     05/28/18 0218           Carlisle Cater, PA-C 05/28/18 Jonna Coup, MD 06/01/18 1236

## 2018-05-28 DIAGNOSIS — I10 Essential (primary) hypertension: Secondary | ICD-10-CM | POA: Diagnosis not present

## 2018-05-28 DIAGNOSIS — R0789 Other chest pain: Secondary | ICD-10-CM | POA: Diagnosis not present

## 2018-05-28 DIAGNOSIS — R0602 Shortness of breath: Secondary | ICD-10-CM | POA: Diagnosis not present

## 2018-05-28 DIAGNOSIS — R000 Tachycardia, unspecified: Secondary | ICD-10-CM | POA: Diagnosis not present

## 2018-05-28 LAB — TROPONIN I: Troponin I: <0.03 ng/mL (ref <0.03)

## 2018-05-28 LAB — TSH: TSH: 2.713 u[IU]/mL (ref 0.350–4.500)

## 2018-05-28 LAB — D-DIMER, QUANTITATIVE (NOT AT ARMC): D-Dimer, Quant: <0.27 ug/mL-FEU (ref 0.00–0.50)

## 2018-05-28 MED ORDER — LISINOPRIL 10 MG PO TABS: 10.0000 mg | ORAL_TABLET | Freq: Every day | ORAL | 0 refills | Status: DC

## 2018-05-28 MED FILL — LISINOPRIL 10 MG TABLET: 10 | 30 days supply | Qty: 30 | Fill #0

## 2018-05-28 NOTE — ED Notes (Signed)
Reviewed discharge instructions and prescriptions with patient. Patient verbalizes understanding and has no questions at this time. Pt was unable to sign due to the signature pad not working.

## 2018-05-28 NOTE — Discharge Instructions (Signed)
Please read and follow all provided instructions.  Your diagnoses today include:  1. Shortness of breath   2. Hypertension, unspecified type   3. Chest tightness   4. Tachycardia     Tests performed today include:  An EKG of your heart - is abnormal  A chest x-ray  Cardiac enzymes - a blood test for heart muscle damage,  No signs of heart attack  Blood counts and electrolytes  Thyroid test - was normal  Screening test for blood clot (d-dimer) - was normal  Vital signs. See below for your results today.   Medications prescribed:   Lisinopril - medication for high blood pressure  Take any prescribed medications only as directed.  Follow-up instructions: Please follow-up with your primary care provider and the cardiologist listed in the next week for continued evaluation of your symptoms. You blood pressure will need to be rechecked and monitored. You may also need further evaluation of your heart given abnormal EKG and high blood pressures.   Return instructions:  SEEK IMMEDIATE MEDICAL ATTENTION IF:  You have severe chest pain, especially if the pain is crushing or pressure-like and spreads to the arms, back, neck, or jaw, or if you have sweating, nausea (feeling sick to your stomach), or shortness of breath. THIS IS AN EMERGENCY. Don't wait to see if the pain will go away. Get medical help at once. Call 911 or 0 (operator). DO NOT drive yourself to the hospital.   Your chest pain gets worse and does not go away with rest.   You have an attack of chest pain lasting longer than usual, despite rest and treatment with the medications your caregiver has prescribed.   You wake from sleep with chest pain or shortness of breath.  You feel dizzy or faint.  You have chest pain not typical of your usual pain for which you originally saw your caregiver.   You have any other emergent concerns regarding your health.  Additional Information: Chest pain comes from many different  causes. Your caregiver has diagnosed you as having chest pain that is not specific for one problem, but does not require admission.  You are at low risk for an acute heart condition or other serious illness.   Your vital signs today were: BP (!) 176/113    Pulse (!) 116    Temp 98.1 F (36.7 C)    Resp 14    SpO2 100%  If your blood pressure (BP) was elevated above 135/85 this visit, please have this repeated by your doctor within one month. --------------

## 2018-05-31 ENCOUNTER — Other Ambulatory Visit: Payer: Self-pay

## 2018-05-31 ENCOUNTER — Ambulatory Visit: Payer: 59 | Admitting: Internal Medicine

## 2018-05-31 ENCOUNTER — Ambulatory Visit (INDEPENDENT_AMBULATORY_CARE_PROVIDER_SITE_OTHER): Payer: 59 | Admitting: Internal Medicine

## 2018-05-31 ENCOUNTER — Encounter: Payer: Self-pay | Admitting: Internal Medicine

## 2018-05-31 DIAGNOSIS — I10 Essential (primary) hypertension: Secondary | ICD-10-CM | POA: Insufficient documentation

## 2018-05-31 DIAGNOSIS — R0789 Other chest pain: Secondary | ICD-10-CM

## 2018-05-31 DIAGNOSIS — Z20822 Contact with and (suspected) exposure to covid-19: Secondary | ICD-10-CM | POA: Insufficient documentation

## 2018-05-31 NOTE — Progress Notes (Signed)
Virtual Visit via Video Note  I connected with BODEY FRIZELL on 05/31/18 at  3:00 PM EDT by a video enabled telemedicine application and verified that I am speaking with the correct person using two identifiers.  The patient and the provider were at separate locations throughout the entire encounter.   I discussed the limitations of evaluation and management by telemedicine and the availability of in person appointments. The patient expressed understanding and agreed to proceed.  History of Present Illness: The patient is a 38 y.o. man with visit for ER follow up (in with chest tightness and high BP, EKG with LVH, early MI in mom and dad, some diet and exercises changes for the worse in the last several months, troponin negative times 2, d-dimer negative, labs otherwise unremarkable). Started lisinopril 10 mg daily. Chest pressure started about 1-2 weeks ago and is improving since starting lisinopril. Has no side effects from this. Denies chest pains. Denies fevers or chills or SOB. He has been exercising since ER visit and walking 5-10 miles per day now and chest tightness lifts while walking. Drinking more caffeine recently as well. Overall it is improving. Has tried lisinopril which is helping.   PMH, [place], social history reviewed and updated  Observations/Objective: Appearance: normal, breathing appears normal, casual grooming, abdomen does not appear distended, throat normal, memory normal, mental status is A and O times 3  Assessment and Plan: See problem oriented charting  Follow Up Instructions: lisinopril 10 mg daily, nurse visit for BP check 3-4 weeks and labs that day as well  I discussed the assessment and treatment plan with the patient. The patient was provided an opportunity to ask questions and all were answered. The patient agreed with the plan and demonstrated an understanding of the instructions.   The patient was advised to call back or seek an in-person evaluation if  the symptoms worsen or if the condition fails to improve as anticipated.  Hoyt Koch, MD

## 2018-05-31 NOTE — Assessment & Plan Note (Signed)
Rx for lisinopril 10 mg daily from ER. Will check BP in 3-4 weeks and CMP at that time. He is going to follow up with cardiology given early MI in family members (mom and dad). Is making changes to diet and exercise and would like to make changes this way to control BP if possible. Given LVH he either needs lifestyle modification and/or medication.

## 2018-05-31 NOTE — Assessment & Plan Note (Signed)
Could be related to increase in caffeine and high BP. Asked him to cut back to half caf for coffee and he is already making changes to diet and exercise as well as starting BP medication to control BP.

## 2018-06-03 NOTE — Progress Notes (Signed)
Virtual Visit via Video Note   This visit type was conducted due to national recommendations for restrictions regarding the COVID-19 Pandemic (e.g. social distancing) in an effort to limit this patient's exposure and mitigate transmission in our community.  Due to his co-morbid illnesses, this patient is at least at moderate risk for complications without adequate follow up.  This format is felt to be most appropriate for this patient at this time.  All issues noted in this document were discussed and addressed.  A limited physical exam was performed with this format.  Please refer to the patient's chart for his consent to telehealth for [facility].   Evaluation Performed:  Cardiology Consult  This visit type was conducted due to national recommendations for restrictions regarding the COVID-19 Pandemic (e.g. social distancing).  This format is felt to be most appropriate for this patient at this time.  All issues noted in this document were discussed and addressed.  No physical exam was performed (except for noted visual exam findings with Video Visits).  Please refer to the patient's chart (MyChart message for video visits and phone note for telephone visits) for the patient's consent to telehealth for [facility].  Date:  06/04/2018   ID:  Willie [name], DOB [birth year], MRN [id number]  Patient Location:  [facility]  Provider location:   [place]  PCP:  Hoyt Koch, MD  Cardiologist:  NEW Electrophysiologist:  None   Chief Complaint:  SOB  History of Present Illness:    Willie [name] is a 38 y.o. male who presents via audio/video conferencing for a telehealth visit today.    This is a 38yo male with a history of newly diagnosed HTN who was recently seen in the ER with complaints of chest pain and high BP.  His EKG showed NSR with LVH.  His trop was neg x 2 and all other labs normal.  He was started on Lisinopril 10mg  daily.    Since then he has not had any  further CP. He tells me that he really only had one episode where he had a mild chest discomfort that he said was very vague but if he had to describe it would be described as a pressure 1 out of 10 in severity.  It was midsternal with radiation to left chest.  No radiation to arms.  He denied any shortness of breath, nausea or diaphoresis.  He has had no further episodes since that one in the ER.  Says occasionally he will feel like he cannot take a deep breath but it only occurs when he sitting or resting.  When he is walking he feels much better and has no shortness of breath.  He says he is been fairly sedentary and is trying to get out walking more.  The patient does not have symptoms concerning for COVID-19 infection (fever, chills, cough, or new shortness of breath).   Prior CV studies:   The following studies were reviewed today:  EKG  No past medical history on file. No past surgical history on file.   Current Meds  Medication Sig  . lisinopril (ZESTRIL) 10 MG tablet Take 1 tablet (10 mg total) by mouth daily.     Allergies:   Patient has no known allergies.   Social History   Tobacco Use  . Smoking status: Never Smoker  . Smokeless tobacco: Never Used  Substance Use Topics  . Alcohol use: Yes    Alcohol/week: 1.0 standard drinks  Types: 1 Shots of liquor per week    Comment: rare  . Drug use: No     Family Hx: The patient's family history includes CAD in his father and another family member; Diabetes in his father. There is no history of Cancer, Heart disease, or Stroke.  ROS:   Please see the history of present illness.     All other systems reviewed and are negative.   Labs/Other Tests and Data Reviewed:    Recent Labs: 05/27/2018: ALT 33; BUN 15; Creatinine, Ser 1.12; Hemoglobin 16.0; Platelets 220; Potassium 3.8; Sodium 140; TSH 2.713   Recent Lipid Panel Lab Results  Component Value Date/Time   CHOL 253 (H) 09/15/2012 01:54 PM   TRIG 209.0 (H)  09/15/2012 01:54 PM   HDL 39.00 (L) 09/15/2012 01:54 PM   CHOLHDL 6 09/15/2012 01:54 PM   LDLDIRECT 170.5 09/15/2012 01:54 PM    Wt Readings from Last 3 Encounters:  06/04/18 185 lb (83.9 kg)  03/25/17 190 lb (86.2 kg)  10/11/14 180 lb (81.6 kg)     Objective:    Vital Signs:  Ht 5\' 9"  (1.753 m)   Wt 185 lb (83.9 kg)   BMI 27.32 kg/m    CONSTITUTIONAL:  Well nourished, well developed male in no acute distress.  EYES: anicteric MOUTH: oral mucosa is pink RESPIRATORY: Normal respiratory effort, symmetric expansion CARDIOVASCULAR: No peripheral edema SKIN: No rash, lesions or ulcers MUSCULOSKELETAL: no digital cyanosis NEURO: Cranial Nerves II-XII grossly intact, moves all extremities PSYCH: Intact judgement and insight.  A&O x 3, Mood/affect appropriate   ASSESSMENT & PLAN:    1.  Chest pain -his symptoms are vague and were associated with poorly controlled hypertension which was a new diagnosis for him as well.  He has not had any episodes since starting on lisinopril which makes me think he probably had demand ischemia in the setting of hypertensive urgency.  Given his cardiac risk factors though of premature CAD in his father's brother had an MI in his 30s as well as patient is newly diagnosed hypertension I feel that we should risk stratify further with an exercise treadmill test.  Due to the COVID-19 crisis I will get this in late June when things settle down.  I think he also would benefit from a coronary calcium score to assess future risk as well.  These will also be set up in late June.  I have told him to call me if he has any further episodes of chest discomfort.  2.  Hypertension - newly diagnosed.  BP controlled on Lisinopril 10mg  daily.    3.  COVID-19 Education:The signs and symptoms of COVID-19 were discussed with the patient and how to seek care for testing (follow up with PCP or arrange E-visit).  The importance of social distancing was discussed today.  Patient  Risk:   After full review of this patient's clinical status, I feel that they are at least moderate risk at this time.  Time:   Today, I have spent 15 minutes directly with the patient on video discussing medical problems including CP and HTN.  We also reviewed the symptoms of COVID 19 and the ways to protect against contracting the virus with telehealth technology.  I spent an additional 5 minutes reviewing patient's chart including EKG and PCP office notes.  Medication Adjustments/Labs and Tests Ordered: Current medicines are reviewed at length with the patient today.  Concerns regarding medicines are outlined above.  Tests Ordered: No orders of the  defined types were placed in this encounter.  Medication Changes: No orders of the defined types were placed in this encounter.   Disposition:  Follow up 1 year Signed, Fransico Him, MD  06/04/2018 9:56 AM    [place]

## 2018-06-04 ENCOUNTER — Telehealth (INDEPENDENT_AMBULATORY_CARE_PROVIDER_SITE_OTHER): Payer: 59 | Admitting: Cardiology

## 2018-06-04 ENCOUNTER — Other Ambulatory Visit: Payer: Self-pay

## 2018-06-04 ENCOUNTER — Encounter: Payer: Self-pay | Admitting: Cardiology

## 2018-06-04 VITALS — Ht 69.0 in | Wt 185.0 lb

## 2018-06-04 DIAGNOSIS — R0789 Other chest pain: Secondary | ICD-10-CM | POA: Diagnosis not present

## 2018-06-04 DIAGNOSIS — I10 Essential (primary) hypertension: Secondary | ICD-10-CM | POA: Diagnosis not present

## 2018-06-04 DIAGNOSIS — Z7189 Other specified counseling: Secondary | ICD-10-CM | POA: Diagnosis not present

## 2018-06-04 NOTE — Patient Instructions (Signed)
Medication Instructions:  No changes If you need a refill on your cardiac medications before your next appointment, please call your pharmacy.   Lab work: none If you have labs (blood work) drawn today and your tests are completely normal, you will receive your results only by: Marland Kitchen MyChart Message (if you have MyChart) OR . A paper copy in the mail If you have any lab test that is abnormal or we need to change your treatment, we will call you to review the results.  Testing/Procedures: Your physician has requested that you have an exercise tolerance test.--to be done in late June.  We will contact you to schedule.    For further information please visit [email or web address]. Please also follow instruction sheet, as given.  Dr. Radford Pax recommends a calcium score ct scan - to be done in late June.  We will contact you to schedule  Follow-Up: At [facility], you and your health needs are our priority.  As part of our continuing mission to provide you with exceptional heart care, we have created designated Provider Care Teams.  These Care Teams include your primary Cardiologist (physician) and Advanced Practice Providers (APPs -  Physician Assistants and Nurse Practitioners) who all work together to provide you with the care you need, when you need it. You will need a follow up appointment in 12 months.  Please call our office 12 months in advance to schedule this appointment.  You may see Fransico Him, MD or one of the following Advanced Practice Providers on your designated Care Team:   [place], PA-C Melina Copa, PA-C . Ermalinda Barrios, PA-C  Any Other Special Instructions Will Be Listed Below (If Applicable).

## 2018-06-10 MED FILL — SM BLOOD PRESSURE MONITOR: 1 days supply | Qty: 1 | Fill #0

## 2018-06-21 ENCOUNTER — Other Ambulatory Visit: Payer: Self-pay | Admitting: Cardiology

## 2018-06-21 MED ORDER — LISINOPRIL 10 MG PO TABS: 10.0000 mg | ORAL_TABLET | Freq: Every day | ORAL | 3 refills | Status: DC

## 2018-06-21 MED FILL — LISINOPRIL 10 MG TABLET: 10 | 90 days supply | Qty: 90 | Fill #0

## 2018-07-19 ENCOUNTER — Telehealth: Payer: Self-pay | Admitting: *Deleted

## 2018-07-19 NOTE — Telephone Encounter (Signed)

## 2018-07-20 ENCOUNTER — Ambulatory Visit (INDEPENDENT_AMBULATORY_CARE_PROVIDER_SITE_OTHER)
Admission: RE | Admit: 2018-07-20 | Discharge: 2018-07-20 | Disposition: A | Discharge status: Home / Self Care | Payer: Self-pay | Source: Ambulatory Visit | Attending: Cardiology | Admitting: Cardiology

## 2018-07-20 ENCOUNTER — Other Ambulatory Visit: Payer: Self-pay

## 2018-07-20 DIAGNOSIS — R0789 Other chest pain: Secondary | ICD-10-CM

## 2018-07-20 DIAGNOSIS — I10 Essential (primary) hypertension: Secondary | ICD-10-CM

## 2018-09-25 MED FILL — LISINOPRIL 10 MG TABS: 10 | 90 days supply | Qty: 90 | Fill #1

## 2019-01-03 MED FILL — LISINOPRIL 10 MG TABS: 10 | 90 days supply | Qty: 90 | Fill #2

## 2019-02-02 ENCOUNTER — Telehealth: Payer: Self-pay | Admitting: Radiology

## 2019-02-02 NOTE — Telephone Encounter (Signed)
Yes need to proceed with ETT

## 2019-02-02 NOTE — Telephone Encounter (Signed)
Exercise treadmill test was ordered back in May 2020 when the treadmill room was closed due to Covid. Please advise if patient still needs test scheduled.   If no longer needed please cancel order.

## 2019-06-23 ENCOUNTER — Encounter (HOSPITAL_BASED_OUTPATIENT_CLINIC_OR_DEPARTMENT_OTHER): Payer: Self-pay | Admitting: *Deleted

## 2019-06-23 ENCOUNTER — Other Ambulatory Visit: Payer: Self-pay

## 2019-06-23 ENCOUNTER — Emergency Department (HOSPITAL_BASED_OUTPATIENT_CLINIC_OR_DEPARTMENT_OTHER)
Admission: EM | Admit: 2019-06-23 | Discharge: 2019-06-23 | Disposition: A | Discharge status: Home / Self Care | Payer: 59 | Attending: Emergency Medicine | Admitting: Emergency Medicine

## 2019-06-23 DIAGNOSIS — I10 Essential (primary) hypertension: Secondary | ICD-10-CM | POA: Diagnosis not present

## 2019-06-23 DIAGNOSIS — Z79899 Other long term (current) drug therapy: Secondary | ICD-10-CM | POA: Diagnosis not present

## 2019-06-23 DIAGNOSIS — J398 Other specified diseases of upper respiratory tract: Secondary | ICD-10-CM | POA: Diagnosis not present

## 2019-06-23 DIAGNOSIS — K1379 Other lesions of oral mucosa: Secondary | ICD-10-CM | POA: Diagnosis not present

## 2019-06-23 DIAGNOSIS — J029 Acute pharyngitis, unspecified: Secondary | ICD-10-CM | POA: Diagnosis present

## 2019-06-23 LAB — GROUP A STREP BY PCR: Group A Strep by PCR: NOT DETECTED

## 2019-06-23 MED ORDER — DEXAMETHASONE SODIUM PHOSPHATE 10 MG/ML IJ SOLN
10.0000 mg | Freq: Once | INTRAMUSCULAR | Status: AC
  Administered 2019-06-23: 10 mg via INTRAMUSCULAR
  Filled 2019-06-23: qty 1

## 2019-06-23 NOTE — ED Triage Notes (Signed)
Pt c/o sore throat x 1 day

## 2019-06-23 NOTE — Discharge Instructions (Signed)
Your work-up today was overall reassuring.  Please make sure to return to the ER if you have worsening swelling to your uvula, difficulty breathing, difficulty swallowing, drooling, neck stiffness, tongue swelling, or any other signs of worsening symptoms.  Continue to treat your upper respiratory infection with Mucinex, Tylenol/Advil, fluids, Flonase or Zyrtec for nasal congestion.

## 2019-06-23 NOTE — ED Provider Notes (Signed)
[facility] EMERGENCY DEPARTMENT Provider Note   CSN: [id number] Arrival date & time: 06/23/19  [id number]     History Chief Complaint  Patient presents with  . Sore Throat    Willie [name] is a 39 y.o. male.  HPI  39 year old male with history of hypertension presents to the ER with sore throat and uvula swelling x1 day.  Patient states that he has had some upper respiratory symptoms which is included nasal congestion, postnasal drip.  Nasal discharge has been clear.  No fevers or chills.  Patient has been taking Mucinex and DayQuil for his symptoms.  He states that yesterday he noticed that his uvula was swollen, and it is now slightly painful to swallow.  He is not having any difficulty swallowing, tolerating secretions, moving his neck, breathing.  He has had no difficulty speaking, no noticeable voice change.  No recent oral sex.  He was a little bit concerned and wanted to get this checked out.  He has never had this kind of swelling to his uvula before.    History reviewed. No pertinent past medical history.  Patient Active Problem List   Diagnosis Date Noted  . Essential hypertension 05/31/2018  . Chest tightness 05/31/2018  . Gout 03/25/2017  . Routine general medical examination at a health care facility 03/16/2014    History reviewed. No pertinent surgical history.     Family History  Problem Relation Age of Onset  . Diabetes Father   . CAD Father   . CAD Other        MI in his 65's  . Cancer Neg Hx   . Heart disease Neg Hx   . Stroke Neg Hx     Social History   Tobacco Use  . Smoking status: Never Smoker  . Smokeless tobacco: Never Used  Substance Use Topics  . Alcohol use: Yes    Alcohol/week: 1.0 standard drinks    Types: 1 Shots of liquor per week    Comment: rare  . Drug use: No    Home Medications Prior to Admission medications   Medication Sig Start Date End Date Taking? Authorizing Provider  lisinopril (ZESTRIL) 10 MG tablet  Take 1 tablet (10 mg total) by mouth daily. 06/21/18   Sueanne Margarita, MD    Allergies    Patient has no known allergies.  Review of Systems   Review of Systems  Constitutional: Negative for chills and fever.  HENT: Positive for congestion, rhinorrhea, sinus pressure, sneezing and sore throat. Negative for ear pain, facial swelling, mouth sores, sinus pain, trouble swallowing and voice change.   Respiratory: Negative for cough, choking and shortness of breath.   Cardiovascular: Negative for chest pain.  Gastrointestinal: Negative for abdominal pain.  Neurological: Negative for headaches.    Physical Exam Updated Vital Signs BP (!) 164/115 (BP Location: Right Arm)   Pulse 97   Temp 98.4 F (36.9 C) (Oral)   Resp 16   Ht 5\' 7"  (1.702 m)   Wt 86.2 kg   SpO2 98%   BMI 29.76 kg/m   Physical Exam Constitutional:      General: He is not in acute distress.    Appearance: He is well-developed. He is not ill-appearing or toxic-appearing.     Comments: Well-appearing male, resting comfortably in the ER bed.  HENT:     Head: Normocephalic and atraumatic.     Mouth/Throat:     Mouth: Mucous membranes are moist. No oral lesions.  Pharynx: Uvula midline. Posterior oropharyngeal erythema and uvula swelling present. No oropharyngeal exudate.     Tonsils: No tonsillar exudate or tonsillar abscesses. 0 on the right. 0 on the left.     Comments: Mildly swollen uvula, midline.  No noticeable abscesses.  No tripoding.  Patient tolerating secretions well.  No respiratory distress.  Full range of motion of neck.  No cervical adenopathy/tenderness.  No sublingual/submandibular swelling. Eyes:     Conjunctiva/sclera: Conjunctivae normal.     Pupils: Pupils are equal, round, and reactive to light.  Cardiovascular:     Rate and Rhythm: Normal rate and regular rhythm.     Heart sounds: Normal heart sounds.  Pulmonary:     Effort: Pulmonary effort is normal.     Breath sounds: Normal breath  sounds.  Abdominal:     General: Bowel sounds are normal.     Palpations: Abdomen is soft.     Tenderness: There is no abdominal tenderness.  Skin:    General: Skin is warm and dry.  Neurological:     General: No focal deficit present.     Mental Status: He is alert and oriented to person, place, and time.     ED Results / Procedures / Treatments   Labs (all labs ordered are listed, but only abnormal results are displayed) Labs Reviewed  GROUP A STREP BY PCR    EKG None  Radiology No results found.  Procedures Procedures (including critical care time)  Medications Ordered in ED Medications  dexamethasone (DECADRON) injection 10 mg (10 mg Intramuscular Given 06/23/19 1039)    ED Course  I have reviewed the triage vital signs and the nursing notes.  Pertinent labs & imaging results that were available during my care of the patient were reviewed by me and considered in my medical decision making (see chart for details).    MDM Rules/Calculators/A&P                      39 year old male with mild sore throat and uvula swelling x3 days. On presentation to the ER, the patient is alert and oriented, nontoxic-appearing, no acute distress, tolerating secretions well, with no tripoding.  He has hypertensive with a blood pressure 160/119 on presentation, though based on my chart review, the patient has a history of hypertension and these numbers seem to be consistent with his previous visits.  He was initially slightly tachycardic at 102, which improved throughout the ED course.  Physical exam with mild uvula swelling, it is midline, with no evidence of exudates on tonsils or abscess.  Tongue is normal size, no sublingual/submandibular swelling.  Tolerating secretions well.  No respiratory distress.  Suspect this is secondary to his viral URI.  Discussed this with Dr. Sherry Ruffing, we will give a shot of Decadron and test for strep.  I suspect that the patient was stable for discharge with  strict return precautions.  11:19 AM: Strep PCR negative.  Patient received a shot of Decadron.  I discussed return precautions with the patient, which included worsening swelling, drooling, shortness of breath, neck stiffness, etc.  Patient is overall reassured by the work-up.  All the patient's questions have been answered, he voices understanding is agreeable to this plan.  At this stage in the ED course, the patient has been medically screened and is stable for discharge.   Final Clinical Impression(s) / ED Diagnoses Final diagnoses:  Uvular swelling    Rx / DC Orders ED Discharge Orders  None       Lyndel Safe 06/23/19 1122    Tegeler, Gwenyth Allegra, MD 06/23/19 [phone]

## 2019-08-31 ENCOUNTER — Other Ambulatory Visit: Payer: Self-pay

## 2019-08-31 ENCOUNTER — Ambulatory Visit (INDEPENDENT_AMBULATORY_CARE_PROVIDER_SITE_OTHER): Payer: 59 | Admitting: Internal Medicine

## 2019-08-31 ENCOUNTER — Other Ambulatory Visit: Payer: Self-pay | Admitting: Internal Medicine

## 2019-08-31 ENCOUNTER — Encounter: Payer: Self-pay | Admitting: Internal Medicine

## 2019-08-31 VITALS — BP 146/84 | HR 87 | Temp 98.5°F | Ht 67.0 in | Wt 184.0 lb

## 2019-08-31 DIAGNOSIS — I10 Essential (primary) hypertension: Secondary | ICD-10-CM

## 2019-08-31 DIAGNOSIS — Z0000 Encounter for general adult medical examination without abnormal findings: Secondary | ICD-10-CM | POA: Diagnosis not present

## 2019-08-31 MED ORDER — LISINOPRIL 10 MG PO TABS: 10.0000 mg | ORAL_TABLET | Freq: Every day | ORAL | 3 refills | Status: DC

## 2019-08-31 MED ORDER — FAMOTIDINE 40 MG PO TABS
40.0000 mg | ORAL_TABLET | Freq: Every day | ORAL | 3 refills | Status: DC
Start: 2019-08-31 — End: 2021-02-19

## 2019-08-31 MED FILL — FAMOTIDINE 40 MG TABLET: 40 | 90 days supply | Qty: 90 | Fill #0

## 2019-08-31 MED FILL — LISINOPRIL 10 MG TABS: 10 | 90 days supply | Qty: 90 | Fill #0

## 2019-08-31 NOTE — Progress Notes (Signed)
   Subjective:   Patient ID: Willie [name], male    DOB: 04-Jul-1980, 39 y.o.   MRN: [id number]  HPI The patient is a 39 YO man coming coming in for physical.   PMH, [place], social history reviewed and updated  Review of Systems  Constitutional: Negative.   HENT: Negative.   Eyes: Negative.   Respiratory: Negative for cough, chest tightness and shortness of breath.   Cardiovascular: Negative for chest pain, palpitations and leg swelling.  Gastrointestinal: Negative for abdominal distention, abdominal pain, constipation, diarrhea, nausea and vomiting.       Gerd  Musculoskeletal: Negative.   Skin: Negative.   Neurological: Negative.   Psychiatric/Behavioral: Negative.     Objective:  Physical Exam Constitutional:      Appearance: He is well-developed.  HENT:     Head: Normocephalic and atraumatic.  Cardiovascular:     Rate and Rhythm: Normal rate and regular rhythm.  Pulmonary:     Effort: Pulmonary effort is normal. No respiratory distress.     Breath sounds: Normal breath sounds. No wheezing or rales.  Abdominal:     General: Bowel sounds are normal. There is no distension.     Palpations: Abdomen is soft.     Tenderness: There is no abdominal tenderness. There is no rebound.  Musculoskeletal:     Cervical back: Normal range of motion.  Skin:    General: Skin is warm and dry.  Neurological:     Mental Status: He is alert and oriented to person, place, and time.     Coordination: Coordination normal.     Vitals:   08/31/19 0901  BP: (!) 146/84  Pulse: 87  Temp: 98.5 F (36.9 C)  TempSrc: Oral  SpO2: 97%  Weight: 184 lb (83.5 kg)  Height: 5\' 7"  (1.702 m)    This visit occurred during the SARS-CoV-2 public health emergency.  Safety protocols were in place, including screening questions prior to the visit, additional usage of staff PPE, and extensive cleaning of exam room while observing appropriate contact time as indicated for disinfecting solutions.    Assessment & Plan:

## 2019-08-31 NOTE — Assessment & Plan Note (Signed)
BP mildly elevated, refilled lisinopril 10 mg daily. Checking CMP and adjust as needed.

## 2019-08-31 NOTE — Assessment & Plan Note (Signed)
Flu shot yearly. Covid-19 up to date. Tetanus declines. Counseled about sun safety and mole surveillance. Counseled about the dangers of distracted driving. Given 10 year screening recommendations.

## 2019-08-31 NOTE — Patient Instructions (Addendum)
We have sent in refills of the lisinopril and the pepcid to try for heartburn.   Food Choices for Gastroesophageal Reflux Disease, Adult When you have gastroesophageal reflux disease (GERD), the foods you eat and your eating habits are very important. Choosing the right foods can help ease your discomfort. Think about working with a nutrition specialist (dietitian) to help you make good choices. What are tips for following this plan?  Meals  Choose healthy foods that are low in fat, such as fruits, vegetables, whole grains, low-fat dairy products, and lean meat, fish, and poultry.  Eat small meals often instead of 3 large meals a day. Eat your meals slowly, and in a place where you are relaxed. Avoid bending over or lying down until 2-3 hours after eating.  Avoid eating meals 2-3 hours before bed.  Avoid drinking a lot of liquid with meals.  Cook foods using methods other than frying. Bake, grill, or broil food instead.  Avoid or limit: ? Chocolate. ? Peppermint or spearmint. ? Alcohol. ? Pepper. ? Black and decaffeinated coffee. ? Black and decaffeinated tea. ? Bubbly (carbonated) soft drinks. ? Caffeinated energy drinks and soft drinks.  Limit high-fat foods such as: ? Fatty meat or fried foods. ? Whole milk, cream, butter, or ice cream. ? Nuts and nut butters. ? Pastries, donuts, and sweets made with butter or shortening.  Avoid foods that cause symptoms. These foods may be different for everyone. Common foods that cause symptoms include: ? Tomatoes. ? Oranges, lemons, and limes. ? Peppers. ? Spicy food. ? Onions and garlic. ? Vinegar. Lifestyle  Maintain a healthy weight. Ask your doctor what weight is healthy for you. If you need to lose weight, work with your doctor to do so safely.  Exercise for at least 30 minutes for 5 or more days each week, or as told by your doctor.  Wear loose-fitting clothes.  Do not smoke. If you need help quitting, ask your  doctor.  Sleep with the head of your bed higher than your feet. Use a wedge under the mattress or blocks under the bed frame to raise the head of the bed. Summary  When you have gastroesophageal reflux disease (GERD), food and lifestyle choices are very important in easing your symptoms.  Eat small meals often instead of 3 large meals a day. Eat your meals slowly, and in a place where you are relaxed.  Limit high-fat foods such as fatty meat or fried foods.  Avoid bending over or lying down until 2-3 hours after eating.  Avoid peppermint and spearmint, caffeine, alcohol, and chocolate. This information is not intended to replace advice given to you by your health care provider. Make sure you discuss any questions you have with your health care provider. Document Revised: 04/29/2018 Document Reviewed: 02/12/2016 Elsevier Patient Education  2020 [place] Maintenance, Male Adopting a healthy lifestyle and getting preventive care are important in promoting health and wellness. Ask your health care provider about:  The right schedule for you to have regular tests and exams.  Things you can do on your own to prevent diseases and keep yourself healthy. What should I know about diet, weight, and exercise? Eat a healthy diet   Eat a diet that includes plenty of vegetables, fruits, low-fat dairy products, and lean protein.  Do not eat a lot of foods that are high in solid fats, added sugars, or sodium. Maintain a healthy weight Body mass index (BMI) is a measurement that can  be used to identify possible weight problems. It estimates body fat based on height and weight. Your health care provider can help determine your BMI and help you achieve or maintain a healthy weight. Get regular exercise Get regular exercise. This is one of the most important things you can do for your health. Most adults should:  Exercise for at least 150 minutes each week. The exercise should increase  your heart rate and make you sweat (moderate-intensity exercise).  Do strengthening exercises at least twice a week. This is in addition to the moderate-intensity exercise.  Spend less time sitting. Even light physical activity can be beneficial. Watch cholesterol and blood lipids Have your blood tested for lipids and cholesterol at 39 years of age, then have this test every 5 years. You may need to have your cholesterol levels checked more often if:  Your lipid or cholesterol levels are high.  You are older than 39 years of age.  You are at high risk for heart disease. What should I know about cancer screening? Many types of cancers can be detected early and may often be prevented. Depending on your health history and family history, you may need to have cancer screening at various ages. This may include screening for:  Colorectal cancer.  Prostate cancer.  Skin cancer.  Lung cancer. What should I know about heart disease, diabetes, and high blood pressure? Blood pressure and heart disease  High blood pressure causes heart disease and increases the risk of stroke. This is more likely to develop in people who have high blood pressure readings, are of African descent, or are overweight.  Talk with your health care provider about your target blood pressure readings.  Have your blood pressure checked: ? Every 3-5 years if you are 17-59 years of age. ? Every year if you are 44 years old or older.  If you are between the ages of 30 and 21 and are a current or former smoker, ask your health care provider if you should have a one-time screening for abdominal aortic aneurysm (AAA). Diabetes Have regular diabetes screenings. This checks your fasting blood sugar level. Have the screening done:  Once every three years after age 57 if you are at a normal weight and have a low risk for diabetes.  More often and at a younger age if you are overweight or have a high risk for diabetes. What  should I know about preventing infection? Hepatitis B If you have a higher risk for hepatitis B, you should be screened for this virus. Talk with your health care provider to find out if you are at risk for hepatitis B infection. Hepatitis C Blood testing is recommended for:  Everyone born from [birth year] through 1965.  Anyone with known risk factors for hepatitis C. Sexually transmitted infections (STIs)  You should be screened each year for STIs, including gonorrhea and chlamydia, if: ? You are sexually active and are younger than 39 years of age. ? You are older than 39 years of age and your health care provider tells you that you are at risk for this type of infection. ? Your sexual activity has changed since you were last screened, and you are at increased risk for chlamydia or gonorrhea. Ask your health care provider if you are at risk.  Ask your health care provider about whether you are at high risk for HIV. Your health care provider may recommend a prescription medicine to help prevent HIV infection. If you choose to take  medicine to prevent HIV, you should first get tested for HIV. You should then be tested every 3 months for as long as you are taking the medicine. Follow these instructions at home: Lifestyle  Do not use any products that contain nicotine or tobacco, such as cigarettes, e-cigarettes, and chewing tobacco. If you need help quitting, ask your health care provider.  Do not use street drugs.  Do not share needles.  Ask your health care provider for help if you need support or information about quitting drugs. Alcohol use  Do not drink alcohol if your health care provider tells you not to drink.  If you drink alcohol: ? Limit how much you have to 0-2 drinks a day. ? Be aware of how much alcohol is in your drink. In the [place] one drink equals one 12 oz bottle of beer (355 mL), one 5 oz glass of wine (148 mL), or one 1 oz glass of hard liquor (44 mL). General  instructions  Schedule regular health, dental, and eye exams.  Stay current with your vaccines.  Tell your health care provider if: ? You often feel depressed. ? You have ever been abused or do not feel safe at home. Summary  Adopting a healthy lifestyle and getting preventive care are important in promoting health and wellness.  Follow your health care provider's instructions about healthy diet, exercising, and getting tested or screened for diseases.  Follow your health care provider's instructions on monitoring your cholesterol and blood pressure. This information is not intended to replace advice given to you by your health care provider. Make sure you discuss any questions you have with your health care provider. Document Revised: 12/30/2017 Document Reviewed: 12/30/2017 Elsevier Patient Education  2020 [employer].

## 2019-09-01 LAB — COMPREHENSIVE METABOLIC PANEL
AG Ratio: 1.7 (calc) (ref 1.0–2.5)
ALT: 43 U/L (ref 9–46)
AST: 28 U/L (ref 10–40)
Albumin: 4.8 g/dL (ref 3.6–5.1)
Alkaline phosphatase (APISO): 74 U/L (ref 36–130)
BUN: 18 mg/dL (ref 7–25)
CO2: 30 mmol/L (ref 20–32)
Calcium: 10 mg/dL (ref 8.6–10.3)
Chloride: 101 mmol/L (ref 98–110)
Creat: 1.06 mg/dL (ref 0.60–1.35)
Globulin: 2.8 g/dL (calc) (ref 1.9–3.7)
Glucose, Bld: 115 mg/dL — ABNORMAL HIGH (ref 65–99)
Potassium: 4.1 mmol/L (ref 3.5–5.3)
Sodium: 141 mmol/L (ref 135–146)
Total Bilirubin: 1.4 mg/dL — ABNORMAL HIGH (ref 0.2–1.2)
Total Protein: 7.6 g/dL (ref 6.1–8.1)

## 2019-09-01 LAB — CBC
HCT: 46.8 % (ref 38.5–50.0)
Hemoglobin: 16 g/dL (ref 13.2–17.1)
MCH: 30 pg (ref 27.0–33.0)
MCHC: 34.2 g/dL (ref 32.0–36.0)
MCV: 87.6 fL (ref 80.0–100.0)
MPV: 10.1 fL (ref 7.5–12.5)
Platelets: 199 10*3/uL (ref 140–400)
RBC: 5.34 10*6/uL (ref 4.20–5.80)
RDW: 13.2 % (ref 11.0–15.0)
WBC: 7.6 10*3/uL (ref 3.8–10.8)

## 2019-09-01 LAB — LIPID PANEL
Cholesterol: 261 mg/dL — ABNORMAL HIGH (ref <200)
HDL: 42 mg/dL (ref >=40)
LDL Cholesterol (Calc): 175 mg/dL (calc) — ABNORMAL HIGH
Non-HDL Cholesterol (Calc): 219 mg/dL (calc) — ABNORMAL HIGH (ref <130)
Total CHOL/HDL Ratio: 6.2 (calc) — ABNORMAL HIGH (ref <5.0)
Triglycerides: 270 mg/dL — ABNORMAL HIGH (ref <150)

## 2019-09-07 ENCOUNTER — Telehealth: Payer: Self-pay

## 2019-09-07 ENCOUNTER — Telehealth: Payer: Self-pay | Admitting: Internal Medicine

## 2019-09-07 ENCOUNTER — Other Ambulatory Visit: Payer: Self-pay

## 2019-09-07 ENCOUNTER — Ambulatory Visit (INDEPENDENT_AMBULATORY_CARE_PROVIDER_SITE_OTHER): Payer: 59

## 2019-09-07 ENCOUNTER — Encounter: Payer: Self-pay | Admitting: Internal Medicine

## 2019-09-07 ENCOUNTER — Ambulatory Visit: Payer: 59 | Admitting: Internal Medicine

## 2019-09-07 VITALS — BP 180/100 | HR 105 | Temp 97.8°F | Resp 16

## 2019-09-07 DIAGNOSIS — R6883 Chills (without fever): Secondary | ICD-10-CM | POA: Insufficient documentation

## 2019-09-07 DIAGNOSIS — N069 Isolated proteinuria with unspecified morphologic lesion: Secondary | ICD-10-CM | POA: Diagnosis not present

## 2019-09-07 DIAGNOSIS — I10 Essential (primary) hypertension: Secondary | ICD-10-CM

## 2019-09-07 DIAGNOSIS — K0889 Other specified disorders of teeth and supporting structures: Secondary | ICD-10-CM | POA: Diagnosis not present

## 2019-09-07 DIAGNOSIS — R000 Tachycardia, unspecified: Secondary | ICD-10-CM | POA: Insufficient documentation

## 2019-09-07 DIAGNOSIS — R0602 Shortness of breath: Secondary | ICD-10-CM | POA: Diagnosis not present

## 2019-09-07 LAB — TROPONIN I (HIGH SENSITIVITY): High Sens Troponin I: 10 ng/L (ref 2–17)

## 2019-09-07 MED ORDER — NEBIVOLOL HCL 5 MG PO TABS: 5.0000 mg | ORAL_TABLET | Freq: Every day | ORAL | 0 refills | Status: DC

## 2019-09-07 MED ORDER — INDAPAMIDE 1.25 MG PO TABS: 1.2500 mg | ORAL_TABLET | Freq: Every day | ORAL | 0 refills | Status: DC

## 2019-09-07 MED FILL — BYSTOLIC 5 MG TABLET: 5 | 90 days supply | Qty: 90 | Fill #0

## 2019-09-07 MED FILL — AMOXICILLIN 500 MG CAPSULE: 500 | 6 days supply | Qty: 28 | Fill #0

## 2019-09-07 MED FILL — INDAPAMIDE 1.25 MG TABS: 1.25 | 90 days supply | Qty: 90 | Fill #0

## 2019-09-07 NOTE — Telephone Encounter (Signed)
Pt will need to go to hospital outpatient imaging as a walk in/spoke to pt and he is going to try to see dentist today. If unable, he will go to [facility] to get xray

## 2019-09-07 NOTE — Telephone Encounter (Signed)
Pt came into the office and complains of not feeling well after taking pain medication. Pt stated that took "a lot" and then took a "few". Pt is now not feeling well and states that he "feels funny".   Order for BP and vital check entered per verbal okay from Dr. Ronnald Ramp.   Pt has been added the nurse schedule to assess vitals. Pt may need to be placed on a provider schedule or given instructions for ED visit depending on the results.

## 2019-09-07 NOTE — Progress Notes (Signed)
Subjective:  Patient ID: Willie [name], male    DOB: [birth year]  Age: 39 y.o. MRN: [id number]  CC: Hypertension  This visit occurred during the SARS-CoV-2 public health emergency.  Safety protocols were in place, including screening questions prior to the visit, additional usage of staff PPE, and extensive cleaning of exam room while observing appropriate contact time as indicated for disinfecting solutions.   NEW TO ME  HPI Willie [name] presents for f/up - He tells me he was feeling well until about 2 days ago when he developed the acute onset of a right lower tooth ache.  Over the ensuing hours he has taken several doses of Advil and hydrocodone.  He has since developed shortness of breath, chills, anxiety, and nervousness.  He denies cough, chest pain, diaphoresis, fever, abdominal pain, nausea, vomiting, rash, paresthesias, dysuria, hematuria, diarrhea, or constipation.  Outpatient Medications Prior to Visit  Medication Sig Dispense Refill  . famotidine (PEPCID) 40 MG tablet Take 1 tablet (40 mg total) by mouth daily. 90 tablet 3  . lisinopril (ZESTRIL) 10 MG tablet Take 1 tablet (10 mg total) by mouth daily. 90 tablet 3   No facility-administered medications prior to visit.    ROS Review of Systems  Constitutional: Positive for chills. Negative for fatigue, fever and unexpected weight change.  HENT: Positive for dental problem. Negative for facial swelling, sore throat and trouble swallowing.   Eyes: Negative.  Negative for visual disturbance.  Respiratory: Positive for shortness of breath. Negative for cough, chest tightness and wheezing.   Cardiovascular: Negative for chest pain, palpitations and leg swelling.  Gastrointestinal: Negative for abdominal pain, constipation, diarrhea, nausea and vomiting.  Endocrine: Negative.   Genitourinary: Negative.  Negative for difficulty urinating, dysuria, hematuria and urgency.  Musculoskeletal: Negative.  Negative for  arthralgias and myalgias.  Skin: Negative.  Negative for color change and rash.  Neurological: Negative.  Negative for dizziness, weakness, light-headedness, numbness and headaches.  Hematological: Negative for adenopathy. Does not bruise/bleed easily.  Psychiatric/Behavioral: Negative for behavioral problems, confusion, decreased concentration, dysphoric mood, sleep disturbance and suicidal ideas. The patient is nervous/anxious.     Objective:  BP (!) 180/100 (BP Location: Left Arm, Patient Position: Sitting, Cuff Size: Normal)   Pulse (!) 105   Temp 97.8 F (36.6 C) (Oral)   Resp 16   SpO2 96%   BP Readings from Last 3 Encounters:  09/07/19 (!) 180/100  08/31/19 (!) 146/84  06/23/19 (!) 164/115    Wt Readings from Last 3 Encounters:  08/31/19 184 lb (83.5 kg)  06/23/19 190 lb (86.2 kg)  06/04/18 185 lb (83.9 kg)    Physical Exam Vitals reviewed.  Constitutional:      General: He is not in acute distress.    Appearance: Normal appearance. He is not toxic-appearing or diaphoretic.  HENT:     Nose: Nose normal.     Mouth/Throat:     Lips: Pink.     Mouth: Mucous membranes are moist.     Dentition: Abnormal dentition. Dental caries present. No gingival swelling, dental abscesses or gum lesions.     Tongue: No lesions.     Pharynx: Oropharynx is clear. No oropharyngeal exudate.     Tonsils: No tonsillar exudate.     Comments: The molars on both lower sides are in bad shape.  The floor the mouth is not elevated.  There is no swelling or exudates. Eyes:     General: No scleral icterus.    Conjunctiva/sclera:  Conjunctivae normal.  Cardiovascular:     Rate and Rhythm: Regular rhythm. Tachycardia present.     Heart sounds: No murmur heard.  No friction rub. No gallop.      Comments: EKG - Sinus tachycardia, 102 bpm ?LAE Borderline LVH No Q waves TWI in III Pulmonary:     Effort: Pulmonary effort is normal. No respiratory distress.     Breath sounds: No stridor. No  wheezing, rhonchi or rales.  Chest:     Chest wall: No tenderness.  Abdominal:     General: Abdomen is flat. Bowel sounds are normal. There is no distension.     Palpations: Abdomen is soft. There is no hepatomegaly, splenomegaly or mass.     Tenderness: There is no abdominal tenderness.  Musculoskeletal:        General: Normal range of motion.     Cervical back: Normal range of motion and neck supple.  Lymphadenopathy:     Cervical: No cervical adenopathy.  Skin:    General: Skin is warm and dry.  Neurological:     General: No focal deficit present.     Mental Status: He is alert.  Psychiatric:        Attention and Perception: Attention normal.        Mood and Affect: Mood is anxious. Mood is not depressed or elated. Affect is not flat.        Speech: Speech normal.        Behavior: Behavior normal. Behavior is cooperative.        Thought Content: Thought content normal. Thought content is not paranoid or delusional. Thought content does not include homicidal or suicidal ideation.        Cognition and Memory: Cognition normal.     Lab Results  Component Value Date   WBC 12.0 (H) 09/07/2019   HGB 16.8 09/07/2019   HCT 49.4 09/07/2019   PLT 267 09/07/2019   GLUCOSE 110 (H) 09/07/2019   CHOL 261 (H) 08/31/2019   TRIG 270 (H) 08/31/2019   HDL 42 08/31/2019   LDLDIRECT 170.5 09/15/2012   LDLCALC 175 (H) 08/31/2019   ALT 36 09/07/2019   AST 20 09/07/2019   NA 139 09/07/2019   K 4.0 09/07/2019   CL 99 09/07/2019   CREATININE 1.08 09/07/2019   BUN 18 09/07/2019   CO2 27 09/07/2019   TSH 3.94 09/07/2019   HGBA1C 5.3 09/15/2012    DG Chest 2 View  Result Date: 09/07/2019 CLINICAL DATA:  Chills and tachycardia EXAM: CHEST - 2 VIEW COMPARISON:  None. FINDINGS: Normal heart size and mediastinal contours. No acute infiltrate or edema. No effusion or pneumothorax. No acute osseous findings. IMPRESSION: No active cardiopulmonary disease. Electronically Signed   By: Monte Fantasia M.D.   On: 09/07/2019 10:03    Assessment & Plan:   Jomes was seen today for hypertension.  Diagnoses and all orders for this visit:  Chills without fever- He has a slightly elevated white blood cell count.  This is likely coming from a tooth infection.  I ordered a Panorex but he deferred on that and instead tells me he saw a dentist today and has been placed on some antibiotics. -     Cancel: CBC with Differential/Platelet; Future -     Hepatic function panel; Future -     DG Chest 2 View; Future -     CBC with Differential/Platelet; Future -     CBC with Differential/Platelet -  Hepatic function panel -     DG Orthopantogram; Future  SOB (shortness of breath)- The evaluation for cardiopulmonary causes is reassuring and unremarkable.  This is likely related to the anxiety of having a dental infection.  He will let me know if he develops any new or worsening symptoms. -     Cancel: CBC with Differential/Platelet; Future -     Brain natriuretic peptide; Future -     D-dimer, quantitative (not at [facility]); Future -     DG Chest 2 View; Future -     Troponin I (High Sensitivity); Future -     CBC with Differential/Platelet; Future -     CBC with Differential/Platelet -     Troponin I (High Sensitivity) -     D-dimer, quantitative (not at [facility]) -     Brain natriuretic peptide  Tachycardia- See above. -     Cancel: CBC with Differential/Platelet; Future -     Brain natriuretic peptide; Future -     D-dimer, quantitative (not at [facility]); Future -     Urine drugs of abuse scrn w alc, routine (Ref Lab); Future -     TSH; Future -     EKG 12-Lead -     CBC with Differential/Platelet; Future -     CBC with Differential/Platelet -     TSH -     Urine drugs of abuse scrn w alc, routine (Ref Lab) -     D-dimer, quantitative (not at [facility]) -     Brain natriuretic peptide -     nebivolol (BYSTOLIC) 5 MG tablet; Take 1 tablet (5 mg total) by mouth daily.  Essential  hypertension- I have ordered a UDS to screen for substance abuse.  His blood pressure is not adequately well controlled.  Will continue the ACE inhibitor but add on a thiazide diuretic and nebivolol. -     BASIC METABOLIC PANEL WITH GFR; Future -     Urine drugs of abuse scrn w alc, routine (Ref Lab); Future -     Urinalysis, Routine w reflex microscopic; Future -     TSH; Future -     CBC with Differential/Platelet; Future -     CBC with Differential/Platelet -     Urinalysis, Routine w reflex microscopic -     TSH -     Urine drugs of abuse scrn w alc, routine (Ref Lab) -     BASIC METABOLIC PANEL WITH GFR -     indapamide (LOZOL) 1.25 MG tablet; Take 1 tablet (1.25 mg total) by mouth daily. -     nebivolol (BYSTOLIC) 5 MG tablet; Take 1 tablet (5 mg total) by mouth daily.  Toothache- See above. 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  I have asked him to see a nephrologist about this. -     Ambulatory referral to Nephrology  Other orders -     MICROSCOPIC MESSAGE   I am having Gwenyth Allegra. Dass start on indapamide and nebivolol. I am also having him maintain his lisinopril and famotidine.  Meds ordered this encounter  Medications  . indapamide (LOZOL) 1.25 MG tablet    Sig: Take 1 tablet (1.25 mg total) by mouth daily.    Dispense:  90 tablet    Refill:  0  . nebivolol (BYSTOLIC) 5 MG tablet    Sig: Take 1 tablet (5 mg total) by  mouth daily.    Dispense:  90 tablet    Refill:  0   I spent 60 minutes in preparing to see the patient by review of recent labs, imaging and procedures, obtaining and reviewing separately obtained history, communicating with the patient and family or caregiver, ordering medications, tests or procedures, and documenting clinical information in the EHR including the differential Dx, treatment, and any further evaluation and other management of 1. Chills without  fever 2. SOB (shortness of breath) 3. Tachycardia 4. Essential hypertension 5. Toothache 6. Isolated proteinuria with morphologic lesion 7. Hypercalcemia     Follow-up: Return in about 1 week (around 09/14/2019).  Scarlette Calico, MD

## 2019-09-07 NOTE — Patient Instructions (Signed)
Sinus Tachycardia  Sinus tachycardia is a kind of fast heartbeat. In sinus tachycardia, the heart beats more than 100 times a minute. Sinus tachycardia starts in a part of the heart called the sinus node. Sinus tachycardia may be harmless, or it may be a sign of a serious condition. What are the causes? This condition may be caused by:  Exercise or exertion.  A fever.  Pain.  Loss of body fluids (dehydration).  Severe bleeding (hemorrhage).  Anxiety and stress.  Certain substances, including: ? Alcohol. ? Caffeine. ? Tobacco and nicotine products. ? Cold medicines. ? Illegal drugs.  Medical conditions including: ? Heart disease. ? An infection. ? An overactive thyroid (hyperthyroidism). ? A lack of red blood cells (anemia). What are the signs or symptoms? Symptoms of this condition include:  A feeling that the heart is beating quickly (palpitations).  Suddenly noticing your heartbeat (cardiac awareness).  Dizziness.  Tiredness (fatigue).  Shortness of breath.  Chest pain.  Nausea.  Fainting. How is this diagnosed? This condition is diagnosed with:  A physical exam.  Other tests, such as: ? Blood tests. ? An electrocardiogram (ECG). This test measures the electrical activity of the heart. ? Ambulatory cardiac monitor. This records your heartbeats for 24 hours or more. You may be referred to a heart specialist (cardiologist). How is this treated? Treatment for this condition depends on the cause or the underlying condition. Treatment may involve:  Treating the underlying condition.  Taking new medicines or changing your current medicines as told by your health care provider.  Making changes to your diet or lifestyle. Follow these instructions at home: Lifestyle   Do not use any products that contain nicotine or tobacco, such as cigarettes and e-cigarettes. If you need help quitting, ask your health care provider.  Do not use illegal drugs, such as  cocaine.  Learn relaxation methods to help you when you get stressed or anxious. These include deep breathing.  Avoid caffeine or other stimulants. Alcohol use   Do not drink alcohol if: ? Your health care provider tells you not to drink. ? You are pregnant, may be pregnant, or are planning to become pregnant.  If you drink alcohol, limit how much you have: ? 0-1 drink a day for women. ? 0-2 drinks a day for men.  Be aware of how much alcohol is in your drink. In the [place] one drink equals one typical bottle of beer (12 oz), one-half glass of wine (5 oz), or one shot of hard liquor (1 oz). General instructions  Drink enough fluids to keep your urine pale yellow.  Take over-the-counter and prescription medicines only as told by your health care provider.  Keep all follow-up visits as told by your health care provider. This is important. Contact a health care provider if you have:  A fever.  Vomiting or diarrhea that does not go away. Get help right away if you:  Have pain in your chest, upper arms, jaw, or neck.  Become weak or dizzy.  Feel faint.  Have palpitations that do not go away. Summary  In sinus tachycardia, the heart beats more than 100 times a minute.  Sinus tachycardia may be harmless, or it may be a sign of a serious condition.  Treatment for this condition depends on the cause or the underlying condition.  Get help right away if you have pain in your chest, upper arms, jaw, or neck. This information is not intended to replace advice given to you by   your health care provider. Make sure you discuss any questions you have with your health care provider. Document Revised: 02/25/2017 Document Reviewed: 02/25/2017 Elsevier Patient Education  2020 [employer]  

## 2019-09-07 NOTE — Telephone Encounter (Signed)
F/u   The patient went to the dentist today was told tooth was infected / antibiotic was given .   Wanted to let the MD know

## 2019-09-07 NOTE — Telephone Encounter (Signed)
    Patient states he went to [facility] location, he was told they do not do [facility] Where should he go for xray?

## 2019-09-07 NOTE — Telephone Encounter (Signed)
Note sent to [facility] to help with scheduling this.   Do you think [facility] can do this?

## 2019-09-08 DIAGNOSIS — N069 Isolated proteinuria with unspecified morphologic lesion: Secondary | ICD-10-CM | POA: Insufficient documentation

## 2019-09-08 LAB — URINALYSIS, ROUTINE W REFLEX MICROSCOPIC
Bacteria, UA: NONE SEEN /HPF
Bilirubin Urine: NEGATIVE
Glucose, UA: NEGATIVE
Hgb urine dipstick: NEGATIVE
Hyaline Cast: NONE SEEN /LPF
Leukocytes,Ua: NEGATIVE
Nitrite: NEGATIVE
RBC / HPF: NONE SEEN /HPF (ref 0–2)
Specific Gravity, Urine: 1.021 (ref 1.001–1.03)
Squamous Epithelial / HPF: NONE SEEN /HPF (ref <=5)
WBC, UA: NONE SEEN /HPF (ref 0–5)
pH: 6 (ref 5.0–8.0)

## 2019-09-08 LAB — HEPATIC FUNCTION PANEL
AG Ratio: 1.8 (calc) (ref 1.0–2.5)
ALT: 36 U/L (ref 9–46)
AST: 20 U/L (ref 10–40)
Albumin: 5.3 g/dL — ABNORMAL HIGH (ref 3.6–5.1)
Alkaline phosphatase (APISO): 81 U/L (ref 36–130)
Bilirubin, Direct: 0.1 mg/dL (ref 0.0–0.2)
Globulin: 3 g/dL (calc) (ref 1.9–3.7)
Indirect Bilirubin: 0.7 mg/dL (calc) (ref 0.2–1.2)
Total Bilirubin: 0.8 mg/dL (ref 0.2–1.2)
Total Protein: 8.3 g/dL — ABNORMAL HIGH (ref 6.1–8.1)

## 2019-09-08 LAB — TIQ-MISC

## 2019-09-08 LAB — CBC WITH DIFFERENTIAL/PLATELET
Absolute Monocytes: 744 cells/uL (ref 200–950)
Basophils Absolute: 72 cells/uL (ref 0–200)
Basophils Relative: 0.6 %
Eosinophils Absolute: 156 cells/uL (ref 15–500)
Eosinophils Relative: 1.3 %
HCT: 49.4 % (ref 38.5–50.0)
Hemoglobin: 16.8 g/dL (ref 13.2–17.1)
Lymphs Abs: 2124 cells/uL (ref 850–3900)
MCH: 30 pg (ref 27.0–33.0)
MCHC: 34 g/dL (ref 32.0–36.0)
MCV: 88.2 fL (ref 80.0–100.0)
MPV: 10.1 fL (ref 7.5–12.5)
Monocytes Relative: 6.2 %
Neutro Abs: 8904 cells/uL — ABNORMAL HIGH (ref 1500–7800)
Neutrophils Relative %: 74.2 %
Platelets: 267 10*3/uL (ref 140–400)
RBC: 5.6 10*6/uL (ref 4.20–5.80)
RDW: 13.2 % (ref 11.0–15.0)
Total Lymphocyte: 17.7 %
WBC: 12 10*3/uL — ABNORMAL HIGH (ref 3.8–10.8)

## 2019-09-08 LAB — BASIC METABOLIC PANEL WITH GFR
BUN: 18 mg/dL (ref 7–25)
CO2: 27 mmol/L (ref 20–32)
Calcium: 10.6 mg/dL — ABNORMAL HIGH (ref 8.6–10.3)
Chloride: 99 mmol/L (ref 98–110)
Creat: 1.08 mg/dL (ref 0.60–1.35)
GFR, Est African American: 100 mL/min/{1.73_m2} (ref >=60)
GFR, Est Non African American: 86 mL/min/{1.73_m2} (ref >=60)
Glucose, Bld: 110 mg/dL — ABNORMAL HIGH (ref 65–99)
Potassium: 4 mmol/L (ref 3.5–5.3)
Sodium: 139 mmol/L (ref 135–146)

## 2019-09-08 LAB — TSH: TSH: 3.94 mIU/L (ref 0.40–4.50)

## 2019-09-08 LAB — BRAIN NATRIURETIC PEPTIDE: Brain Natriuretic Peptide: 15 pg/mL (ref <100)

## 2019-09-08 LAB — D-DIMER, QUANTITATIVE: D-Dimer, Quant: 0.2 mcg/mL FEU (ref <0.50)

## 2019-09-11 ENCOUNTER — Other Ambulatory Visit: Payer: Self-pay

## 2019-09-11 ENCOUNTER — Emergency Department (HOSPITAL_BASED_OUTPATIENT_CLINIC_OR_DEPARTMENT_OTHER)
Admission: EM | Admit: 2019-09-11 | Discharge: 2019-09-11 | Disposition: A | Discharge status: Home / Self Care | Payer: 59 | Attending: Emergency Medicine | Admitting: Emergency Medicine

## 2019-09-11 ENCOUNTER — Encounter (HOSPITAL_BASED_OUTPATIENT_CLINIC_OR_DEPARTMENT_OTHER): Payer: Self-pay | Admitting: *Deleted

## 2019-09-11 DIAGNOSIS — I10 Essential (primary) hypertension: Secondary | ICD-10-CM | POA: Insufficient documentation

## 2019-09-11 DIAGNOSIS — Z5321 Procedure and treatment not carried out due to patient leaving prior to being seen by health care provider: Secondary | ICD-10-CM | POA: Insufficient documentation

## 2019-09-11 HISTORY — DX: Essential (primary) hypertension: I10

## 2019-09-11 LAB — BASIC METABOLIC PANEL
Anion gap: 13 (ref 5–15)
BUN: 28 mg/dL — ABNORMAL HIGH (ref 6–20)
CO2: 26 mmol/L (ref 22–32)
Calcium: 10 mg/dL (ref 8.9–10.3)
Chloride: 98 mmol/L (ref 98–111)
Creatinine, Ser: 1.15 mg/dL (ref 0.61–1.24)
GFR calc Af Amer: >60 mL/min (ref >60)
GFR calc non Af Amer: >60 mL/min (ref >60)
Glucose, Bld: 134 mg/dL — ABNORMAL HIGH (ref 70–99)
Potassium: 4.5 mmol/L (ref 3.5–5.1)
Sodium: 137 mmol/L (ref 135–145)

## 2019-09-11 LAB — CBC WITH DIFFERENTIAL/PLATELET
Abs Immature Granulocytes: 0.06 10*3/uL (ref 0.00–0.07)
Basophils Absolute: 0.1 10*3/uL (ref 0.0–0.1)
Basophils Relative: 1 %
Eosinophils Absolute: 0.2 10*3/uL (ref 0.0–0.5)
Eosinophils Relative: 2 %
HCT: 49.3 % (ref 39.0–52.0)
Hemoglobin: 17 g/dL (ref 13.0–17.0)
Immature Granulocytes: 1 %
Lymphocytes Relative: 22 %
Lymphs Abs: 2.8 10*3/uL (ref 0.7–4.0)
MCH: 29.8 pg (ref 26.0–34.0)
MCHC: 34.5 g/dL (ref 30.0–36.0)
MCV: 86.5 fL (ref 80.0–100.0)
Monocytes Absolute: 0.8 10*3/uL (ref 0.1–1.0)
Monocytes Relative: 7 %
Neutro Abs: 8.8 10*3/uL — ABNORMAL HIGH (ref 1.7–7.7)
Neutrophils Relative %: 67 %
Platelets: 259 10*3/uL (ref 150–400)
RBC: 5.7 MIL/uL (ref 4.22–5.81)
RDW: 12.3 % (ref 11.5–15.5)
WBC: 12.8 10*3/uL — ABNORMAL HIGH (ref 4.0–10.5)
nRBC: 0 % (ref 0.0–0.2)

## 2019-09-11 LAB — TROPONIN I (HIGH SENSITIVITY): Troponin I (High Sensitivity): 4 ng/L (ref <18)

## 2019-09-11 NOTE — ED Triage Notes (Addendum)
Pt states he had med change for hypertension that started on Thursday morning. Concerned about his blood pressure still being elevated. States he has not slept well the past couple days. States he feels a mild discomfort in his anterior chest and headache. Denies any other symptoms. States he is currently being treated with amoxicillin for an abscessed tooth. Denies any fevers.

## 2019-09-12 LAB — URINE DRUGS OF ABUSE SCREEN W ALC, ROUTINE (REF LAB)
Amphetamines, Urine: NEGATIVE ng/mL
Barbiturate Quant, Ur: NEGATIVE ng/mL
Benzodiazepine Quant, Ur: NEGATIVE ng/mL
Cannabinoid Quant, Ur: NEGATIVE ng/mL
Cocaine (Metab.): NEGATIVE ng/mL
Ethanol, Urine: NEGATIVE %
Methadone Screen, Urine: NEGATIVE ng/mL
PCP Quant, Ur: NEGATIVE ng/mL
Propoxyphene: NEGATIVE ng/mL

## 2019-09-12 LAB — SPECIMEN STATUS REPORT

## 2019-09-12 LAB — OPIATES CONFIRMATION, URINE: OPIATES: NEGATIVE

## 2019-09-13 ENCOUNTER — Encounter: Payer: Self-pay | Admitting: Internal Medicine

## 2019-09-13 ENCOUNTER — Ambulatory Visit: Payer: 59 | Admitting: Internal Medicine

## 2019-09-13 ENCOUNTER — Other Ambulatory Visit: Payer: Self-pay

## 2019-09-13 DIAGNOSIS — I10 Essential (primary) hypertension: Secondary | ICD-10-CM | POA: Diagnosis not present

## 2019-09-13 DIAGNOSIS — F411 Generalized anxiety disorder: Secondary | ICD-10-CM | POA: Diagnosis not present

## 2019-09-13 DIAGNOSIS — R739 Hyperglycemia, unspecified: Secondary | ICD-10-CM | POA: Diagnosis not present

## 2019-09-13 DIAGNOSIS — F322 Major depressive disorder, single episode, severe without psychotic features: Secondary | ICD-10-CM | POA: Insufficient documentation

## 2019-09-13 DIAGNOSIS — E559 Vitamin D deficiency, unspecified: Secondary | ICD-10-CM | POA: Diagnosis not present

## 2019-09-13 MED ORDER — VORTIOXETINE HBR 10 MG PO TABS: 10.0000 mg | ORAL_TABLET | Freq: Every day | ORAL | 0 refills | Status: AC

## 2019-09-13 MED ORDER — CLONAZEPAM 0.5 MG PO TABS: 0.5000 mg | ORAL_TABLET | Freq: Three times a day (TID) | ORAL | 1 refills | Status: DC | PRN

## 2019-09-13 MED ORDER — VORTIOXETINE HBR 5 MG PO TABS: 5.0000 mg | ORAL_TABLET | Freq: Every day | ORAL | 0 refills | Status: AC

## 2019-09-13 MED FILL — clonazePAM 0.5 MG TABS: 0.5 | 30 days supply | Qty: 90 | Fill #0

## 2019-09-13 NOTE — Progress Notes (Signed)
Subjective:  Patient ID: Willie [name], male    DOB: 1980-02-25  Age: 40 y.o. MRN: [id number]  CC: Hypertension and Depression  This visit occurred during the SARS-CoV-2 public health emergency.  Safety protocols were in place, including screening questions prior to the visit, additional usage of staff PPE, and extensive cleaning of exam room while observing appropriate contact time as indicated for disinfecting solutions.    HPI Willie [name] presents for f/up -  1.  He tells me that his dental pain is much better.  He is taking amoxicillin and is scheduled for an extraction soon.  2.  Since I last saw him he was seen in the emergency department for work-up of atypical chest pain.  Results were unremarkable.  He describes a sensation that he cannot get a deep, comfortable breath.  He also complains of insomnia but he does not describe his symptoms of a's anxiety or panic.  He says he feels better when he takes a long walk.  He has a poor appetite and complains of weight loss.  He describes mild anhedonia.  He does not feel worthless, helpless, or hopeless.  He denies SI or HI.  3.  He tells me his blood pressure is much better.  He denies headache, blurred vision, palpitations, edema, or fatigue.  Outpatient Medications Prior to Visit  Medication Sig Dispense Refill  . amoxicillin (AMOXIL) 500 MG capsule Take by mouth.    . famotidine (PEPCID) 40 MG tablet Take 1 tablet (40 mg total) by mouth daily. 90 tablet 3  . indapamide (LOZOL) 1.25 MG tablet Take 1 tablet (1.25 mg total) by mouth daily. 90 tablet 0  . lisinopril (ZESTRIL) 10 MG tablet Take 1 tablet (10 mg total) by mouth daily. 90 tablet 3  . nebivolol (BYSTOLIC) 5 MG tablet Take 1 tablet (5 mg total) by mouth daily. 90 tablet 0   No facility-administered medications prior to visit.    ROS Review of Systems  Constitutional: Positive for appetite change and unexpected weight change. Negative for chills, diaphoresis,  fatigue and fever.  HENT: Positive for dental problem.   Eyes: Negative for visual disturbance.  Respiratory: Negative for cough, chest tightness, shortness of breath and wheezing.   Cardiovascular: Negative for chest pain, palpitations and leg swelling.  Gastrointestinal: Negative for abdominal pain, constipation, diarrhea, nausea and vomiting.  Endocrine: Negative.   Genitourinary: Negative.  Negative for difficulty urinating and dysuria.  Musculoskeletal: Negative for arthralgias, myalgias and neck pain.  Skin: Negative.  Negative for color change.  Neurological: Negative.  Negative for dizziness, weakness, light-headedness and headaches.  Hematological: Negative for adenopathy. Does not bruise/bleed easily.  Psychiatric/Behavioral: Positive for dysphoric mood and sleep disturbance. Negative for behavioral problems, confusion, decreased concentration, hallucinations and suicidal ideas. The patient is not nervous/anxious.     Objective:  BP 134/82 (BP Location: Left Arm, Patient Position: Sitting, Cuff Size: Normal)   Pulse 77   Temp 98.3 F (36.8 C) (Oral)   Resp 16   Ht 5\' 7"  (1.702 m)   Wt 179 lb 8 oz (81.4 kg)   SpO2 99%   BMI 28.11 kg/m   BP Readings from Last 3 Encounters:  09/13/19 134/82  09/11/19 (!) 155/101  09/07/19 (!) 180/100    Wt Readings from Last 3 Encounters:  09/13/19 179 lb 8 oz (81.4 kg)  09/11/19 183 lb (83 kg)  08/31/19 184 lb (83.5 kg)    Physical Exam Vitals reviewed.  Constitutional:  Appearance: Normal appearance.  HENT:     Nose: Nose normal.     Mouth/Throat:     Mouth: Mucous membranes are moist.  Eyes:     General: No scleral icterus.    Conjunctiva/sclera: Conjunctivae normal.  Cardiovascular:     Rate and Rhythm: Normal rate and regular rhythm.     Pulses: Normal pulses.     Heart sounds: No murmur heard.   Pulmonary:     Effort: Pulmonary effort is normal.     Breath sounds: No stridor. No wheezing, rhonchi or rales.    Abdominal:     General: Abdomen is flat. Bowel sounds are normal.     Palpations: There is no mass.     Tenderness: There is no abdominal tenderness. There is no guarding.  Musculoskeletal:        General: Normal range of motion.     Cervical back: Neck supple. No tenderness.     Right lower leg: No edema.     Left lower leg: No edema.  Lymphadenopathy:     Cervical: No cervical adenopathy.  Skin:    General: Skin is warm and dry.  Neurological:     General: No focal deficit present.     Mental Status: He is alert and oriented to person, place, and time. Mental status is at baseline.  Psychiatric:        Mood and Affect: Mood normal.        Behavior: Behavior normal.     Lab Results  Component Value Date   WBC 12.8 (H) 09/11/2019   HGB 17.0 09/11/2019   HCT 49.3 09/11/2019   PLT 259 09/11/2019   GLUCOSE 110 (H) 09/13/2019   CHOL 261 (H) 08/31/2019   TRIG 270 (H) 08/31/2019   HDL 42 08/31/2019   LDLDIRECT 170.5 09/15/2012   LDLCALC 175 (H) 08/31/2019   ALT 36 09/07/2019   AST 20 09/07/2019   NA 137 09/13/2019   K 4.5 09/13/2019   CL 96 (L) 09/13/2019   CREATININE 1.19 09/13/2019   BUN 21 09/13/2019   CO2 27 09/13/2019   TSH 3.94 09/07/2019   HGBA1C 5.3 09/13/2019    No results found.  Assessment & Plan:   Willie [name] was seen today for hypertension and depression.  Diagnoses and all orders for this visit:  Hypercalcemia- His calcium level remains mildly elevated at 10.5.  His vitamin D level is low and his phosphorus and PTH levels are in the normal range.  I think his hypercalcemia is due to secondary hyperparathyroidism. -     VITAMIN D 25 Hydroxy (Vit-D Deficiency, Fractures); Future -     Phosphorus; Future -     PTH, intact and calcium; Future -     BASIC METABOLIC PANEL WITH GFR; Future -     BASIC METABOLIC PANEL WITH GFR -     PTH, intact and calcium -     Phosphorus -     VITAMIN D 25 Hydroxy (Vit-D Deficiency, Fractures)  Essential  hypertension- His blood pressure is adequately well controlled.  Will continue the current 3 drug regimen. -     VITAMIN D 25 Hydroxy (Vit-D Deficiency, Fractures); Future -     BASIC METABOLIC PANEL WITH GFR; Future -     BASIC METABOLIC PANEL WITH GFR -     VITAMIN D 25 Hydroxy (Vit-D Deficiency, Fractures)  Current severe episode of major depressive disorder without psychotic features without prior episode ([place])- I recommended that he treat  this with Trintellix. -     vortioxetine HBr (TRINTELLIX) 5 MG TABS tablet; Take 1 tablet (5 mg total) by mouth daily for 7 days. -     vortioxetine HBr (TRINTELLIX) 10 MG TABS tablet; Take 1 tablet (10 mg total) by mouth daily for 14 days.  GAD (generalized anxiety disorder) -     clonazePAM (KLONOPIN) 0.5 MG tablet; Take 1 tablet (0.5 mg total) by mouth 3 (three) times daily as needed for anxiety.  Hyperglycemia- His A1c is normal.  I think this is reactive hyperglycemia. -     Hemoglobin A1c; Future -     Hemoglobin A1c  Vitamin D deficiency disease -     Cholecalciferol 1.25 MG (50000 UT) capsule; Take 1 capsule (50,000 Units total) by mouth once a week.   I am having Willie Allegra. [name] start on vortioxetine HBr, vortioxetine HBr, clonazePAM, and Cholecalciferol. I am also having him maintain his lisinopril, famotidine, indapamide, nebivolol, and amoxicillin.  Meds ordered this encounter  Medications  . vortioxetine HBr (TRINTELLIX) 5 MG TABS tablet    Sig: Take 1 tablet (5 mg total) by mouth daily for 7 days.    Dispense:  7 tablet    Refill:  0  . vortioxetine HBr (TRINTELLIX) 10 MG TABS tablet    Sig: Take 1 tablet (10 mg total) by mouth daily for 14 days.    Dispense:  14 tablet    Refill:  0  . clonazePAM (KLONOPIN) 0.5 MG tablet    Sig: Take 1 tablet (0.5 mg total) by mouth 3 (three) times daily as needed for anxiety.    Dispense:  90 tablet    Refill:  1  . Cholecalciferol 1.25 MG (50000 UT) capsule    Sig: Take 1 capsule  (50,000 Units total) by mouth once a week.    Dispense:  12 capsule    Refill:  0   I spent 50 minutes in preparing to see the patient by review of recent labs, imaging and procedures, obtaining and reviewing separately obtained history, communicating with the patient and family or caregiver, ordering medications, tests or procedures, and documenting clinical information in the EHR including the differential Dx, treatment, and any further evaluation and other management of 1. Hypercalcemia 2. Essential hypertension 3. Current severe episode of major depressive disorder without psychotic features without prior episode (Willie [name]) 4. GAD (generalized anxiety disorder) 5. Hyperglycemia 6. Vitamin D deficiency disease      Follow-up: Return in about 6 weeks (around 10/25/2019).  Scarlette Calico, MD

## 2019-09-13 NOTE — Patient Instructions (Signed)
Major Depressive Disorder, Adult Major depressive disorder (MDD) is a mental health condition. It may also be called clinical depression or unipolar depression. MDD usually causes feelings of sadness, hopelessness, or helplessness. MDD can also cause physical symptoms. It can interfere with work, school, relationships, and other everyday activities. MDD may be mild, moderate, or severe. It may occur once (single episode major depressive disorder) or it may occur multiple times (recurrent major depressive disorder). What are the causes? The exact cause of this condition is not known. MDD is most likely caused by a combination of things, which may include:  Genetic factors. These are traits that are passed along from parent to child.  Individual factors. Your personality, your behavior, and the way you handle your thoughts and feelings may contribute to MDD. This includes personality traits and behaviors learned from others.  Physical factors, such as: ? Differences in the part of your brain that controls emotion. This part of your brain may be different than it is in people who do not have MDD. ? Long-term (chronic) medical or psychiatric illnesses.  Social factors. Traumatic experiences or major life changes may play a role in the development of MDD. What increases the risk? This condition is more likely to develop in women. The following factors may also make you more likely to develop MDD:  A family history of depression.  Troubled family relationships.  Abnormally low levels of certain brain chemicals.  Traumatic events in childhood, especially abuse or the loss of a parent.  Being under a lot of stress, or long-term stress, especially from upsetting life experiences or losses.  A history of: ? Chronic physical illness. ? Other mental health disorders. ? Substance abuse.  Poor living conditions.  Experiencing social exclusion or discrimination on a regular basis. What are the  signs or symptoms? The main symptoms of MDD typically include:  Constant depressed or irritable mood.  Loss of interest in things and activities. MDD symptoms may also include:  Sleeping or eating too much or too little.  Unexplained weight change.  Fatigue or low energy.  Feelings of worthlessness or guilt.  Difficulty thinking clearly or making decisions.  Thoughts of suicide or of harming others.  Physical agitation or weakness.  Isolation. Severe cases of MDD may also occur with other symptoms, such as:  Delusions or hallucinations, in which you imagine things that are not real (psychotic depression).  Low-level depression that lasts at least a year (chronic depression or persistent depressive disorder).  Extreme sadness and hopelessness (melancholic depression).  Trouble speaking and moving (catatonic depression). How is this diagnosed? This condition may be diagnosed based on:  Your symptoms.  Your medical history, including your mental health history. This may involve tests to evaluate your mental health. You may be asked questions about your lifestyle, including any drug and alcohol use, and how long you have had symptoms of MDD.  A physical exam.  Blood tests to rule out other conditions. You must have a depressed mood and at least four other MDD symptoms most of the day, nearly every day in the same 2-week timeframe before your health care provider can confirm a diagnosis of MDD. How is this treated? This condition is usually treated by mental health professionals, such as psychologists, psychiatrists, and clinical social workers. You may need more than one type of treatment. Treatment may include:  Psychotherapy. This is also called talk therapy or counseling. Types of psychotherapy include: ? Cognitive behavioral therapy (CBT). This type of therapy   teaches you to recognize unhealthy feelings, thoughts, and behaviors, and replace them with positive thoughts  and actions. ? Interpersonal therapy (IPT). This helps you to improve the way you relate to and communicate with others. ? Family therapy. This treatment includes members of your family.  Medicine to treat anxiety and depression, or to help you control certain emotions and behaviors.  Lifestyle changes, such as: ? Limiting alcohol and drug use. ? Exercising regularly. ? Getting plenty of sleep. ? Making healthy eating choices. ? Spending more time outdoors.  Treatments involving stimulation of the brain can be used in situations with extremely severe symptoms, or when medicine or other therapies do not work over time. These treatments include electroconvulsive therapy, transcranial magnetic stimulation, and vagal nerve stimulation. Follow these instructions at home: Activity  Return to your normal activities as told by your health care provider.  Exercise regularly and spend time outdoors as told by your health care provider. General instructions  Take over-the-counter and prescription medicines only as told by your health care provider.  Do not drink alcohol. If you drink alcohol, limit your alcohol intake to no more than 1 drink a day for nonpregnant women and 2 drinks a day for men. One drink equals 12 oz of beer, 5 oz of wine, or 1 oz of hard liquor. Alcohol can affect any antidepressant medicines you are taking. Talk to your health care provider about your alcohol use.  Eat a healthy diet and get plenty of sleep.  Find activities that you enjoy doing, and make time to do them.  Consider joining a support group. Your health care provider may be able to recommend a support group.  Keep all follow-up visits as told by your health care provider. This is important. Where to find more information National Alliance on Mental Illness  www.nami.org [place] National Institute of Mental Health  www.nimh.nih.gov National Suicide Prevention Lifeline  1-800-273-TALK (8255). This is  free, 24-hour help. Contact a health care provider if:  Your symptoms get worse.  You develop new symptoms. Get help right away if:  You self-harm.  You have serious thoughts about hurting yourself or others.  You see, hear, taste, smell, or feel things that are not present (hallucinate). This information is not intended to replace advice given to you by your health care provider. Make sure you discuss any questions you have with your health care provider. Document Revised: 12/19/2016 Document Reviewed: 07/18/2015 Elsevier Patient Education  2020 Elsevier Inc.  

## 2019-09-14 ENCOUNTER — Encounter: Payer: Self-pay | Admitting: Internal Medicine

## 2019-09-14 DIAGNOSIS — E559 Vitamin D deficiency, unspecified: Secondary | ICD-10-CM | POA: Insufficient documentation

## 2019-09-14 DIAGNOSIS — F411 Generalized anxiety disorder: Secondary | ICD-10-CM | POA: Insufficient documentation

## 2019-09-14 DIAGNOSIS — R739 Hyperglycemia, unspecified: Secondary | ICD-10-CM | POA: Insufficient documentation

## 2019-09-14 LAB — BASIC METABOLIC PANEL WITH GFR
BUN: 21 mg/dL (ref 7–25)
CO2: 27 mmol/L (ref 20–32)
Calcium: 10.5 mg/dL — ABNORMAL HIGH (ref 8.6–10.3)
Chloride: 96 mmol/L — ABNORMAL LOW (ref 98–110)
Creat: 1.19 mg/dL (ref 0.60–1.35)
GFR, Est African American: 89 mL/min/{1.73_m2} (ref >=60)
GFR, Est Non African American: 76 mL/min/{1.73_m2} (ref >=60)
Glucose, Bld: 110 mg/dL — ABNORMAL HIGH (ref 65–99)
Potassium: 4.5 mmol/L (ref 3.5–5.3)
Sodium: 137 mmol/L (ref 135–146)

## 2019-09-14 LAB — PTH, INTACT AND CALCIUM
Calcium: 10.5 mg/dL — ABNORMAL HIGH (ref 8.6–10.3)
PTH: 43 pg/mL (ref 14–64)

## 2019-09-14 LAB — HEMOGLOBIN A1C
Hgb A1c MFr Bld: 5.3 % of total Hgb (ref <5.7)
Mean Plasma Glucose: 105 (calc)
eAG (mmol/L): 5.8 (calc)

## 2019-09-14 LAB — PHOSPHORUS: Phosphorus: 3 mg/dL (ref 2.5–4.5)

## 2019-09-14 LAB — VITAMIN D 25 HYDROXY (VIT D DEFICIENCY, FRACTURES): Vit D, 25-Hydroxy: 16 ng/mL — ABNORMAL LOW (ref 30–100)

## 2019-09-14 MED ORDER — CHOLECALCIFEROL 1.25 MG (50000 UT) PO CAPS: 50000.0000 [IU] | ORAL_CAPSULE | ORAL | 0 refills | Status: DC

## 2019-09-14 MED FILL — VIT D3-50 50,000 UNITS CAPS: 1.25 MG | 84 days supply | Qty: 12 | Fill #0

## 2019-09-20 NOTE — Addendum Note (Signed)
Addended by: Juliet Rude on: 09/20/2019 04:48 PM   Modules accepted: Orders

## 2019-10-14 MED FILL — FLUARIX QUADRIVALENT 0.5 ML: 0.5 | 1 days supply | Qty: 1 | Fill #0

## 2019-10-17 ENCOUNTER — Encounter: Payer: Self-pay | Admitting: Internal Medicine

## 2019-10-17 MED FILL — AMOXICILLIN 500 MG CAPSULE: 500 | 7 days supply | Qty: 21 | Fill #0

## 2019-10-17 MED FILL — HYDROCODON-APAP 5-325: 5-325 | 2 days supply | Qty: 5 | Fill #0

## 2019-10-17 MED FILL — IBUPROFEN 400 MG TABS: 400 | 5 days supply | Qty: 30 | Fill #0

## 2019-10-17 MED FILL — DEXAMETHASONE 4 MG TABLET: 4 | 3 days supply | Qty: 9 | Fill #0

## 2019-10-17 MED FILL — CHLORHEXIDINE 0.12% RINSE: 0.12 | 17 days supply | Qty: 473 | Fill #0

## 2019-10-18 ENCOUNTER — Other Ambulatory Visit: Payer: Self-pay | Admitting: Internal Medicine

## 2019-10-18 DIAGNOSIS — Z1159 Encounter for screening for other viral diseases: Secondary | ICD-10-CM

## 2019-10-18 DIAGNOSIS — Z114 Encounter for screening for human immunodeficiency virus [HIV]: Secondary | ICD-10-CM

## 2019-10-18 NOTE — Telephone Encounter (Signed)
Did you want to see the patient in office or just come in for a lab recheck?

## 2019-12-09 DIAGNOSIS — N069 Isolated proteinuria with unspecified morphologic lesion: Secondary | ICD-10-CM | POA: Diagnosis not present

## 2019-12-09 DIAGNOSIS — I129 Hypertensive chronic kidney disease with stage 1 through stage 4 chronic kidney disease, or unspecified chronic kidney disease: Secondary | ICD-10-CM | POA: Diagnosis not present

## 2019-12-26 MED FILL — LISINOPRIL 10 MG TABS: 10 | 90 days supply | Qty: 90 | Fill #0

## 2020-03-26 ENCOUNTER — Other Ambulatory Visit (HOSPITAL_COMMUNITY): Payer: Self-pay | Admitting: Pharmacist

## 2020-04-10 MED FILL — LISINOPRIL 10 MG TABS: 10 | 90 days supply | Qty: 90 | Fill #0

## 2020-08-06 ENCOUNTER — Other Ambulatory Visit (HOSPITAL_COMMUNITY): Payer: Self-pay

## 2020-08-06 MED FILL — Lisinopril Tab 10 MG: ORAL | 90 days supply | Qty: 90 | Fill #0 | Status: AC

## 2020-11-23 ENCOUNTER — Other Ambulatory Visit (HOSPITAL_COMMUNITY): Payer: Self-pay

## 2020-11-23 MED ORDER — PENICILLIN V POTASSIUM 500 MG PO TABS
ORAL_TABLET | ORAL | 0 refills | Status: DC
  Filled 2020-11-23: qty 30, 10d supply, fill #0

## 2021-02-15 ENCOUNTER — Telehealth: Payer: Self-pay | Admitting: Internal Medicine

## 2021-02-15 DIAGNOSIS — R079 Chest pain, unspecified: Secondary | ICD-10-CM | POA: Diagnosis not present

## 2021-02-15 DIAGNOSIS — I160 Hypertensive urgency: Secondary | ICD-10-CM | POA: Diagnosis not present

## 2021-02-15 NOTE — Telephone Encounter (Signed)
Pt states he was seen in the ed on 02-14-2021 due to anxiety  Pt states he is unable to sleep and requesting a c/b to discuss alternative methods to help him sleep   Pt's next ov is 02-19-2021

## 2021-02-18 NOTE — Telephone Encounter (Signed)
I have not seen since 2021 so would recommend he keep visit.

## 2021-02-18 NOTE — Telephone Encounter (Signed)
Fyi.

## 2021-02-19 ENCOUNTER — Other Ambulatory Visit (HOSPITAL_COMMUNITY): Payer: Self-pay

## 2021-02-19 ENCOUNTER — Encounter: Payer: Self-pay | Admitting: Internal Medicine

## 2021-02-19 ENCOUNTER — Ambulatory Visit: Payer: 59 | Admitting: Internal Medicine

## 2021-02-19 ENCOUNTER — Other Ambulatory Visit: Payer: Self-pay

## 2021-02-19 VITALS — BP 160/84 | HR 91 | Temp 98.4°F | Ht 67.0 in | Wt 191.2 lb

## 2021-02-19 DIAGNOSIS — I10 Essential (primary) hypertension: Secondary | ICD-10-CM | POA: Diagnosis not present

## 2021-02-19 DIAGNOSIS — E559 Vitamin D deficiency, unspecified: Secondary | ICD-10-CM

## 2021-02-19 DIAGNOSIS — R739 Hyperglycemia, unspecified: Secondary | ICD-10-CM | POA: Diagnosis not present

## 2021-02-19 DIAGNOSIS — F411 Generalized anxiety disorder: Secondary | ICD-10-CM | POA: Diagnosis not present

## 2021-02-19 DIAGNOSIS — M109 Gout, unspecified: Secondary | ICD-10-CM | POA: Diagnosis not present

## 2021-02-19 DIAGNOSIS — Z114 Encounter for screening for human immunodeficiency virus [HIV]: Secondary | ICD-10-CM | POA: Diagnosis not present

## 2021-02-19 LAB — LDL CHOLESTEROL, DIRECT: Direct LDL: 163 mg/dL

## 2021-02-19 LAB — URIC ACID: Uric Acid, Serum: 7.7 mg/dL (ref 4.0–7.8)

## 2021-02-19 LAB — COMPREHENSIVE METABOLIC PANEL
ALT: 40 U/L (ref 0–53)
AST: 26 U/L (ref 0–37)
Albumin: 4.9 g/dL (ref 3.5–5.2)
Alkaline Phosphatase: 74 U/L (ref 39–117)
BUN: 16 mg/dL (ref 6–23)
CO2: 33 mEq/L — ABNORMAL HIGH (ref 19–32)
Calcium: 9.9 mg/dL (ref 8.4–10.5)
Chloride: 101 mEq/L (ref 96–112)
Creatinine, Ser: 1.1 mg/dL (ref 0.40–1.50)
GFR: 83.73 mL/min (ref >60.00)
Glucose, Bld: 86 mg/dL (ref 70–99)
Potassium: 4.1 mEq/L (ref 3.5–5.1)
Sodium: 140 mEq/L (ref 135–145)
Total Bilirubin: 1.1 mg/dL (ref 0.2–1.2)
Total Protein: 8 g/dL (ref 6.0–8.3)

## 2021-02-19 LAB — CBC
HCT: 47.7 % (ref 39.0–52.0)
Hemoglobin: 16.1 g/dL (ref 13.0–17.0)
MCHC: 33.6 g/dL (ref 30.0–36.0)
MCV: 86.2 fl (ref 78.0–100.0)
Platelets: 245 10*3/uL (ref 150.0–400.0)
RBC: 5.54 Mil/uL (ref 4.22–5.81)
RDW: 13 % (ref 11.5–15.5)
WBC: 7.6 10*3/uL (ref 4.0–10.5)

## 2021-02-19 LAB — LIPID PANEL
Cholesterol: 242 mg/dL — ABNORMAL HIGH (ref 0–200)
HDL: 38.4 mg/dL — ABNORMAL LOW (ref >39.00)
NonHDL: 203.53
Total CHOL/HDL Ratio: 6
Triglycerides: 252 mg/dL — ABNORMAL HIGH (ref 0.0–149.0)
VLDL: 50.4 mg/dL — ABNORMAL HIGH (ref 0.0–40.0)

## 2021-02-19 LAB — VITAMIN B12: Vitamin B-12: 362 pg/mL (ref 211–911)

## 2021-02-19 LAB — VITAMIN D 25 HYDROXY (VIT D DEFICIENCY, FRACTURES): VITD: 24.97 ng/mL — ABNORMAL LOW (ref 30.00–100.00)

## 2021-02-19 LAB — TSH: TSH: 1.11 u[IU]/mL (ref 0.35–5.50)

## 2021-02-19 LAB — HEMOGLOBIN A1C: Hgb A1c MFr Bld: 5.4 % (ref 4.6–6.5)

## 2021-02-19 MED ORDER — LISINOPRIL 10 MG PO TABS
ORAL_TABLET | Freq: Every day | ORAL | 3 refills | Status: DC
  Filled 2021-02-19: qty 90, 90d supply, fill #0

## 2021-02-19 NOTE — Assessment & Plan Note (Signed)
Checking vitamin D level and some fatigue and dizziness lately.

## 2021-02-19 NOTE — Assessment & Plan Note (Signed)
Rx lisinopril 10 mg daily and monitor. In 1 week let us know BP and if not at goal <140/90 will need increase to 20 mg daily. Checking CMP today.

## 2021-02-19 NOTE — Assessment & Plan Note (Signed)
Elevated sugar on recent labs needs Hga1c testing today.

## 2021-02-19 NOTE — Assessment & Plan Note (Signed)
Needs uric acid for follow up. No current flare.

## 2021-02-19 NOTE — Assessment & Plan Note (Signed)
He thinks that GI gas is causing this and has been taking gas-x with relief and improving anxiety. Does not need treatment for this currently.

## 2021-02-19 NOTE — Patient Instructions (Signed)
We will check the labs today and have refilled the lisinopril.

## 2021-02-19 NOTE — Progress Notes (Signed)
° °  Subjective:   Patient ID: Willie [name], male    DOB: 03-Aug-1980, 41 y.o.   MRN: [id number]  HPI The patient is a 41 YO man coming in for ER follow up. Lost to follow up since 2021. BP started elevating again due to GI discomfort and diet changes. He started taking lisinopril 10 mg daily about 4 days ago. BP 226/120 in the ER [facility]. Records not available for review but he states labs and EKG okay, liver numbers and sugar elevated.   PMH, [facility], social history reviewed and updated  Review of Systems  Constitutional:  Positive for fatigue.  HENT: Negative.    Eyes: Negative.   Respiratory:  Negative for cough, chest tightness and shortness of breath.   Cardiovascular:  Negative for chest pain, palpitations and leg swelling.  Gastrointestinal:  Positive for abdominal distention. Negative for abdominal pain, constipation, diarrhea, nausea and vomiting.  Musculoskeletal: Negative.   Skin: Negative.   Neurological:  Positive for dizziness.  Psychiatric/Behavioral:  The patient is nervous/anxious.    Objective:  Physical Exam Constitutional:      Appearance: He is well-developed.  HENT:     Head: Normocephalic and atraumatic.  Cardiovascular:     Rate and Rhythm: Normal rate and regular rhythm.  Pulmonary:     Effort: Pulmonary effort is normal. No respiratory distress.     Breath sounds: Normal breath sounds. No wheezing or rales.  Abdominal:     General: Bowel sounds are normal. There is no distension.     Palpations: Abdomen is soft.     Tenderness: There is no abdominal tenderness. There is no rebound.  Musculoskeletal:     Cervical back: Normal range of motion.  Skin:    General: Skin is warm and dry.  Neurological:     Mental Status: He is alert and oriented to person, place, and time.     Coordination: Coordination normal.    Vitals:   02/19/21 1511  BP: (!) 160/84  Pulse: 91  Temp: 98.4 F (36.9 C)  TempSrc: Oral  SpO2: 97%  Weight: 191 lb 3.2  oz (86.7 kg)  Height: 5\' 7"  (1.702 m)    This visit occurred during the SARS-CoV-2 public health emergency.  Safety protocols were in place, including screening questions prior to the visit, additional usage of staff PPE, and extensive cleaning of exam room while observing appropriate contact time as indicated for disinfecting solutions.   Assessment & Plan:

## 2021-02-20 LAB — HIV ANTIBODY (ROUTINE TESTING W REFLEX): HIV 1&2 Ab, 4th Generation: NONREACTIVE

## 2021-05-11 IMAGING — CT CT HEART SCORING
2 series · 15 of 20 positions shown, 17 images · non-contrast
Comparison: None.
COMPARISON: None.

Addendum:
EXAM:
OVER-READ INTERPRETATION  CT CHEST

The following report is an over-read performed by radiologist Dr.
does not include interpretation of cardiac or coronary anatomy or
pathology. The coronary calcium score CT interpretation by the
cardiologist is attached.
CLINICAL DATA: Risk stratification
Coronary Calcium Score
TECHNIQUE: The patient was scanned on a Siemens Force scanner. Axial
non-contrast 3 mm slices were carried out through the heart. The
data set was analyzed on a dedicated work station and scored using
the Agatson method.

[Series 2: casc 3.0 i36f 2 bestdiast 72 % · axial · 0.36mm/px · z∈[-268,-160]mm · 7 of 48 slices shown, 9 images]
[im 6/48  vessel]
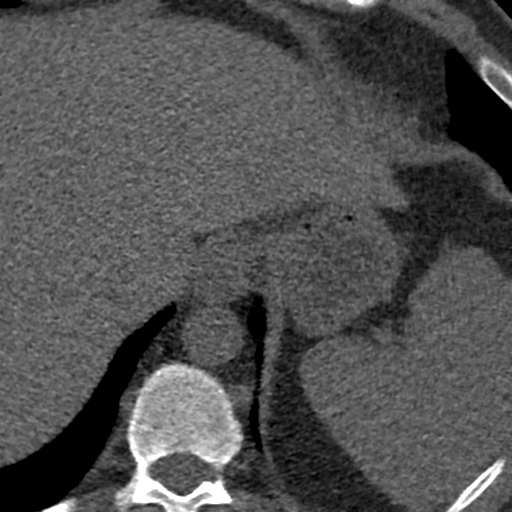
[im 6/48  lung]
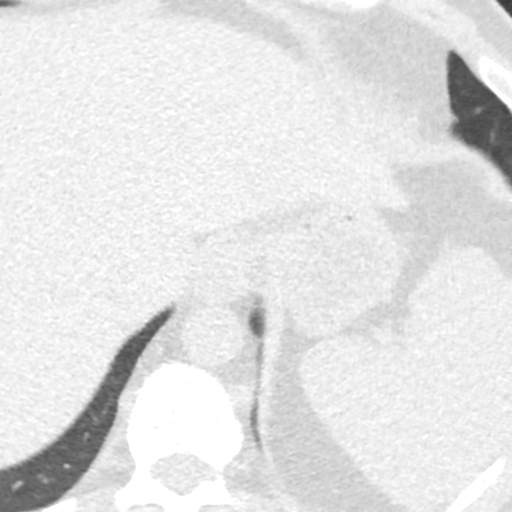
[im 12/48  vessel]
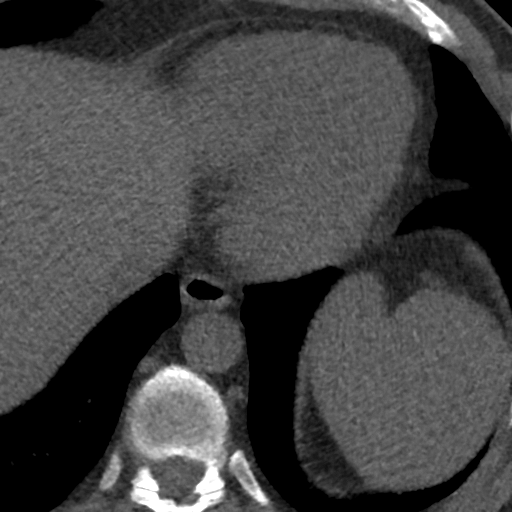
[im 18/48  vessel]
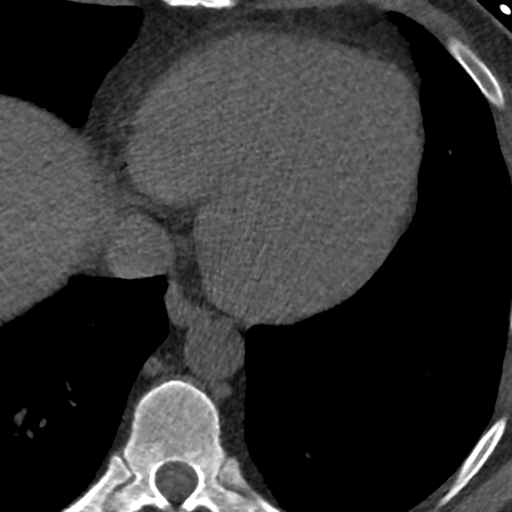
[im 24/48  vessel]
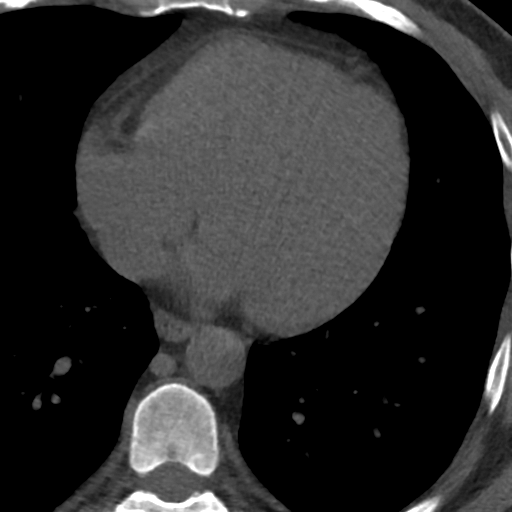
[im 30/48  vessel]
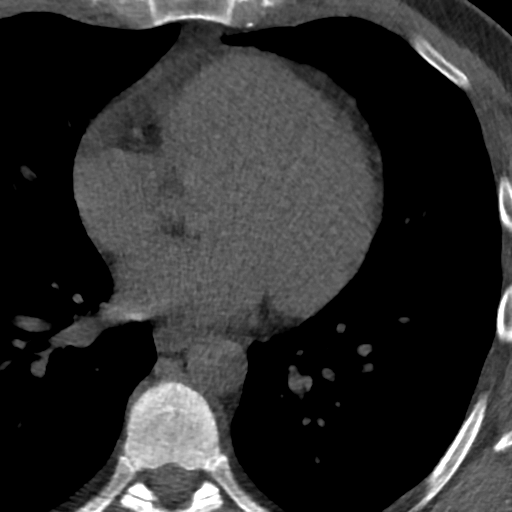
[im 30/48  lung]
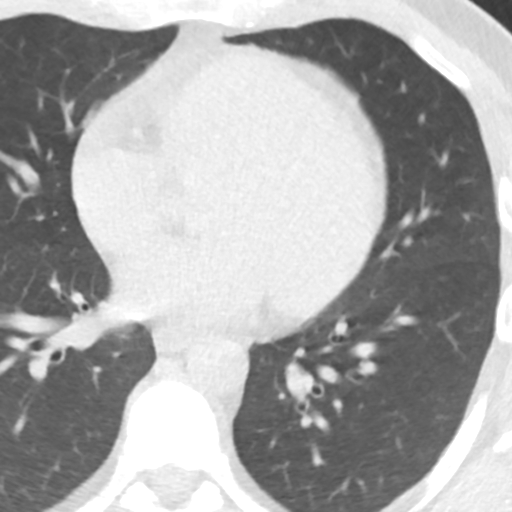
[im 36/48  vessel]
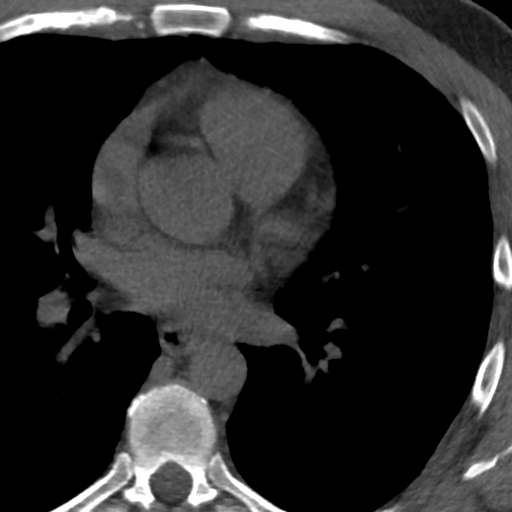
[im 42/48  vessel]
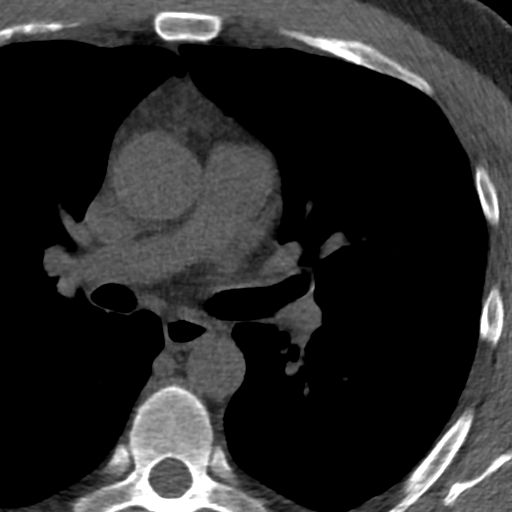

[Series 4: lung st 72 % · axial · 0.68mm/px · z∈[-300,-160]mm · 8 of 59 slices shown]
[im 6/59  lung]
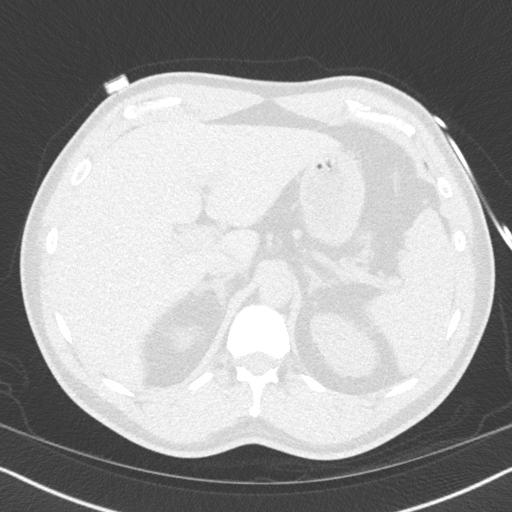
[im 12/59  lung]
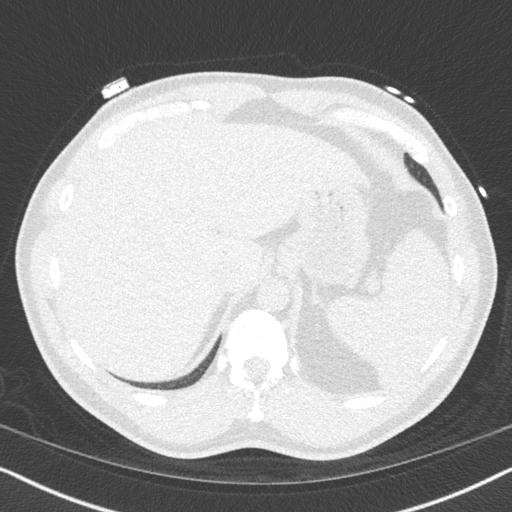
[im 18/59  lung]
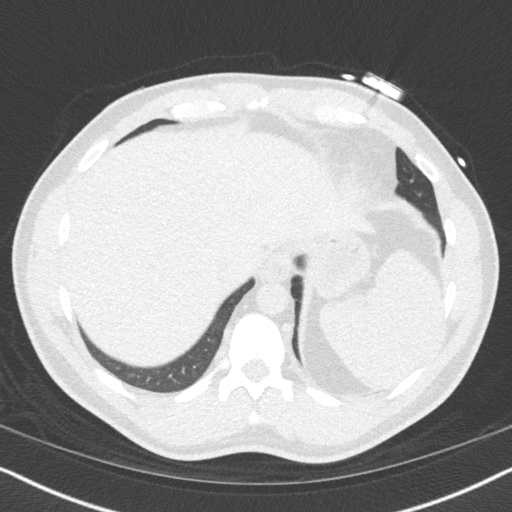
[im 24/59  lung]
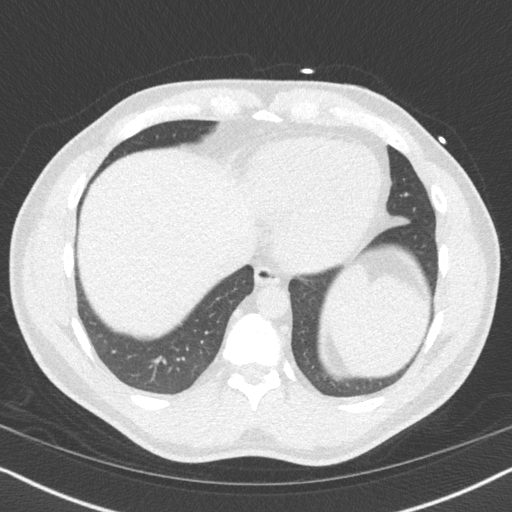
[im 35/59  lung]
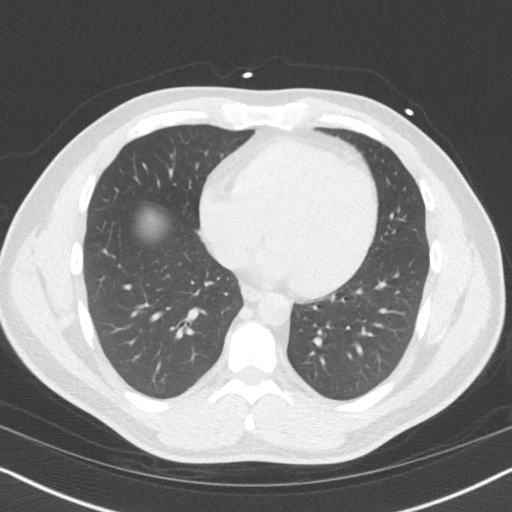
[im 41/59  lung]
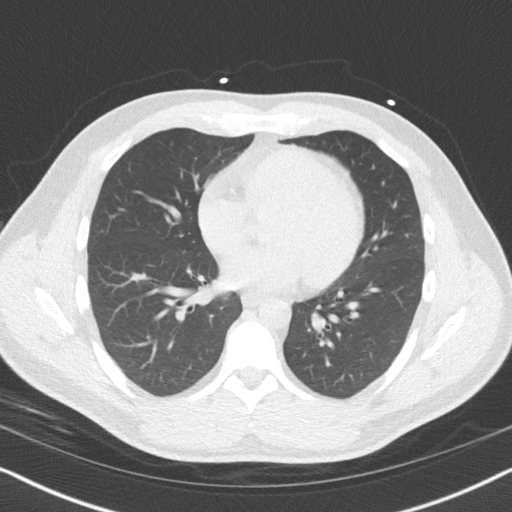
[im 47/59  lung]
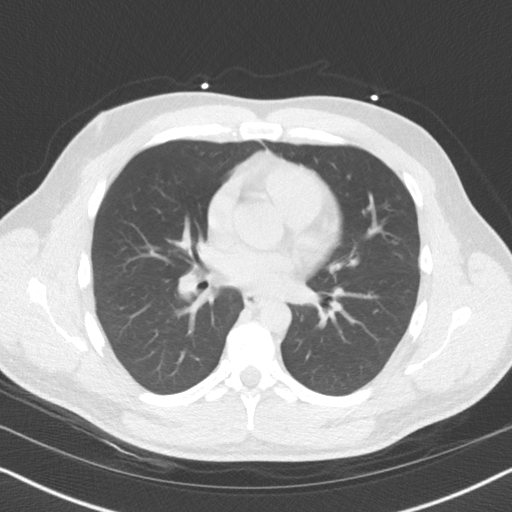
[im 53/59  lung]
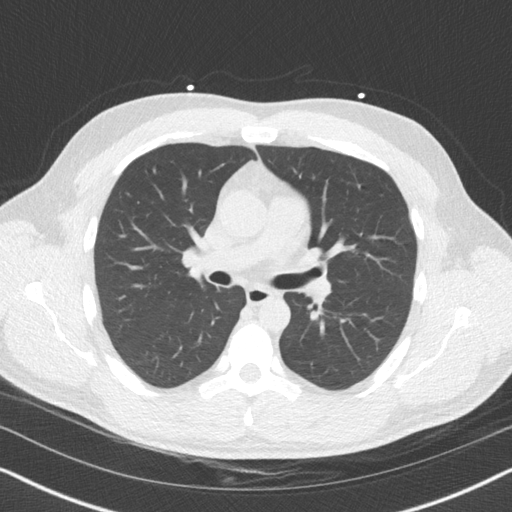

[15 of 20 positions shown; findings below may reference images not displayed]

FINDINGS: The visualized portions of the lower lung fields show no suspicious
nodules, masses, or infiltrates. The visualized portions of the
mediastinum and chest wall are unremarkable.
IMPRESSION: No significant non-cardiac abnormality in visualized portion of the
thorax.
FINDINGS: Non-cardiac: See separate report from [REDACTED].

Ascending Aorta: Normal size, no calcifications.

Pericardium: Normal.

Coronary arteries: Normal origin.
IMPRESSION: Coronary calcium score of 0. This was 0 percentile for age and sex
matched control.

*** End of Addendum ***
EXAM:
OVER-READ INTERPRETATION  CT CHEST

The following report is an over-read performed by radiologist Dr.
does not include interpretation of cardiac or coronary anatomy or
pathology. The coronary calcium score CT interpretation by the
cardiologist is attached.
FINDINGS: The visualized portions of the lower lung fields show no suspicious
nodules, masses, or infiltrates. The visualized portions of the
mediastinum and chest wall are unremarkable.
IMPRESSION: No significant non-cardiac abnormality in visualized portion of the
thorax.

## 2021-05-26 DIAGNOSIS — I160 Hypertensive urgency: Secondary | ICD-10-CM | POA: Diagnosis not present

## 2021-05-26 DIAGNOSIS — I10 Essential (primary) hypertension: Secondary | ICD-10-CM | POA: Diagnosis not present

## 2021-05-26 DIAGNOSIS — R0789 Other chest pain: Secondary | ICD-10-CM | POA: Diagnosis not present

## 2021-05-27 ENCOUNTER — Other Ambulatory Visit (HOSPITAL_COMMUNITY): Payer: Self-pay

## 2021-05-27 MED ORDER — AMLODIPINE BESYLATE 5 MG PO TABS
ORAL_TABLET | ORAL | 0 refills | Status: DC
  Filled 2021-05-27: qty 30, 30d supply, fill #0

## 2021-06-03 ENCOUNTER — Ambulatory Visit: Payer: 59 | Admitting: Internal Medicine

## 2021-06-03 ENCOUNTER — Encounter: Payer: Self-pay | Admitting: Internal Medicine

## 2021-06-03 ENCOUNTER — Other Ambulatory Visit (HOSPITAL_COMMUNITY): Payer: Self-pay

## 2021-06-03 DIAGNOSIS — I10 Essential (primary) hypertension: Secondary | ICD-10-CM | POA: Diagnosis not present

## 2021-06-03 MED ORDER — LISINOPRIL 20 MG PO TABS
20.0000 mg | ORAL_TABLET | Freq: Every day | ORAL | 3 refills | Status: DC
  Filled 2021-06-03: qty 90, 90d supply, fill #0
  Filled 2021-09-02: qty 90, 90d supply, fill #1
  Filled 2021-11-26: qty 90, 90d supply, fill #2
  Filled 2022-03-12 (×2): qty 90, 90d supply, fill #3

## 2021-06-03 NOTE — Patient Instructions (Signed)
We have sent in the lisinopril 20 mg daily. It is okay to take 2 of the 10 mg lisinopril until gone. ?

## 2021-06-03 NOTE — Progress Notes (Signed)
? ?  Subjective:  ? ?Patient ID: Willie [name], male    DOB: [birth year], 41 y.o.   MRN: [id number] ? ?HPI ?The patient is a 41 YO man coming in for high BP recently. Went to urgent care for 190/110s and feeling blurred vision. They told him to increase lisinopril to 20 mg daily from 10 mg daily and gave rx for amlodipine 5 mg to take prn only. Lisinopril 20 mg daily for 3-4 days and BP is dropping.  ? ?Review of Systems  ?Constitutional: Negative.   ?HENT: Negative.    ?Eyes: Negative.   ?Respiratory:  Negative for cough, chest tightness and shortness of breath.   ?Cardiovascular:  Negative for chest pain, palpitations and leg swelling.  ?Gastrointestinal:  Negative for abdominal distention, abdominal pain, constipation, diarrhea, nausea and vomiting.  ?Musculoskeletal: Negative.   ?Skin: Negative.   ?Neurological: Negative.   ?Psychiatric/Behavioral: Negative.    ? ?Objective:  ?Physical Exam ?Constitutional:   ?   Appearance: He is well-developed.  ?HENT:  ?   Head: Normocephalic and atraumatic.  ?Cardiovascular:  ?   Rate and Rhythm: Normal rate and regular rhythm.  ?Pulmonary:  ?   Effort: Pulmonary effort is normal. No respiratory distress.  ?   Breath sounds: Normal breath sounds. No wheezing or rales.  ?Abdominal:  ?   General: Bowel sounds are normal. There is no distension.  ?   Palpations: Abdomen is soft.  ?   Tenderness: There is no abdominal tenderness. There is no rebound.  ?Musculoskeletal:  ?   Cervical back: Normal range of motion.  ?Skin: ?   General: Skin is warm and dry.  ?Neurological:  ?   Mental Status: He is alert and oriented to person, place, and time.  ?   Coordination: Coordination normal.  ? ? ?Vitals:  ? 06/03/21 1351  ?BP: 134/90  ?Pulse: 94  ?Resp: 18  ?SpO2: 97%  ?Weight: 186 lb 3.2 oz (84.5 kg)  ?Height: '5\' 7"'$  (1.702 m)  ? ? ?Assessment & Plan:  ? ?

## 2021-06-04 NOTE — Assessment & Plan Note (Signed)
We will increase his lisinopril to 20 mg daily (new rx done today) and follow up in 1 month to assess BP control. BP is close to goal today and he has been taking this dose for 3-4 days.  ?

## 2021-06-11 DIAGNOSIS — I10 Essential (primary) hypertension: Secondary | ICD-10-CM | POA: Diagnosis not present

## 2021-06-11 DIAGNOSIS — R55 Syncope and collapse: Secondary | ICD-10-CM | POA: Diagnosis not present

## 2021-07-29 ENCOUNTER — Ambulatory Visit: Payer: 59 | Admitting: Internal Medicine

## 2021-07-31 ENCOUNTER — Ambulatory Visit: Payer: 59 | Admitting: Internal Medicine

## 2021-09-02 ENCOUNTER — Other Ambulatory Visit (HOSPITAL_COMMUNITY): Payer: Self-pay

## 2021-10-17 ENCOUNTER — Other Ambulatory Visit (HOSPITAL_COMMUNITY): Payer: Self-pay

## 2021-10-17 MED ORDER — AMOXICILLIN 500 MG PO CAPS
500.0000 mg | ORAL_CAPSULE | Freq: Three times a day (TID) | ORAL | 0 refills | Status: DC
  Filled 2021-10-17: qty 21, 7d supply, fill #0

## 2021-11-26 ENCOUNTER — Other Ambulatory Visit (HOSPITAL_COMMUNITY): Payer: Self-pay

## 2022-01-24 ENCOUNTER — Other Ambulatory Visit (HOSPITAL_COMMUNITY): Payer: Self-pay

## 2022-01-24 ENCOUNTER — Encounter: Payer: Self-pay | Admitting: Internal Medicine

## 2022-01-24 ENCOUNTER — Ambulatory Visit (INDEPENDENT_AMBULATORY_CARE_PROVIDER_SITE_OTHER): Payer: 59 | Admitting: Internal Medicine

## 2022-01-24 VITALS — BP 180/100 | HR 82 | Temp 98.3°F | Ht 67.0 in | Wt 191.0 lb

## 2022-01-24 DIAGNOSIS — I10 Essential (primary) hypertension: Secondary | ICD-10-CM | POA: Diagnosis not present

## 2022-01-24 DIAGNOSIS — R739 Hyperglycemia, unspecified: Secondary | ICD-10-CM | POA: Diagnosis not present

## 2022-01-24 DIAGNOSIS — Z0000 Encounter for general adult medical examination without abnormal findings: Secondary | ICD-10-CM

## 2022-01-24 DIAGNOSIS — E559 Vitamin D deficiency, unspecified: Secondary | ICD-10-CM

## 2022-01-24 LAB — VITAMIN B12: Vitamin B-12: 1371 pg/mL — ABNORMAL HIGH (ref 211–911)

## 2022-01-24 LAB — COMPREHENSIVE METABOLIC PANEL
ALT: 38 U/L (ref 0–53)
AST: 29 U/L (ref 0–37)
Albumin: 4.8 g/dL (ref 3.5–5.2)
Alkaline Phosphatase: 63 U/L (ref 39–117)
BUN: 25 mg/dL — ABNORMAL HIGH (ref 6–23)
CO2: 30 mEq/L (ref 19–32)
Calcium: 10 mg/dL (ref 8.4–10.5)
Chloride: 102 mEq/L (ref 96–112)
Creatinine, Ser: 1 mg/dL (ref 0.40–1.50)
GFR: 93.27 mL/min (ref >60.00)
Glucose, Bld: 91 mg/dL (ref 70–99)
Potassium: 4 mEq/L (ref 3.5–5.1)
Sodium: 139 mEq/L (ref 135–145)
Total Bilirubin: 1 mg/dL (ref 0.2–1.2)
Total Protein: 7.7 g/dL (ref 6.0–8.3)

## 2022-01-24 LAB — VITAMIN D 25 HYDROXY (VIT D DEFICIENCY, FRACTURES): VITD: 25.35 ng/mL — ABNORMAL LOW (ref 30.00–100.00)

## 2022-01-24 LAB — LIPID PANEL
Cholesterol: 252 mg/dL — ABNORMAL HIGH (ref 0–200)
HDL: 40.4 mg/dL (ref >39.00)
NonHDL: 211.34
Total CHOL/HDL Ratio: 6
Triglycerides: 254 mg/dL — ABNORMAL HIGH (ref 0.0–149.0)
VLDL: 50.8 mg/dL — ABNORMAL HIGH (ref 0.0–40.0)

## 2022-01-24 LAB — CBC
HCT: 44.7 % (ref 39.0–52.0)
Hemoglobin: 15.3 g/dL (ref 13.0–17.0)
MCHC: 34.3 g/dL (ref 30.0–36.0)
MCV: 86.4 fl (ref 78.0–100.0)
Platelets: 228 10*3/uL (ref 150.0–400.0)
RBC: 5.17 Mil/uL (ref 4.22–5.81)
RDW: 13.4 % (ref 11.5–15.5)
WBC: 7.6 10*3/uL (ref 4.0–10.5)

## 2022-01-24 LAB — LDL CHOLESTEROL, DIRECT: Direct LDL: 147 mg/dL

## 2022-01-24 LAB — HEMOGLOBIN A1C: Hgb A1c MFr Bld: 5.5 % (ref 4.6–6.5)

## 2022-01-24 MED ORDER — AMLODIPINE BESYLATE 5 MG PO TABS
5.0000 mg | ORAL_TABLET | Freq: Every day | ORAL | 3 refills | Status: DC
  Filled 2022-01-24: qty 90, 90d supply, fill #0
  Filled 2022-03-12 – 2022-04-11 (×2): qty 90, 90d supply, fill #1
  Filled 2022-07-12 – 2022-07-21 (×3): qty 90, 90d supply, fill #2
  Filled 2022-10-15: qty 90, 90d supply, fill #3

## 2022-01-24 NOTE — Assessment & Plan Note (Signed)
Checking vitamin D level today and adjust as needed.

## 2022-01-24 NOTE — Patient Instructions (Addendum)
We have sent in amlodipine to restart and come back in 1 month or so to check the blood pressure. Keep taking the lisinopril as well.   We will check the labs today.

## 2022-01-24 NOTE — Assessment & Plan Note (Signed)
BP moderately to severely elevated without signs of urgency or emergency. Rx amlodipine 5 mg daily and keep lisinopril 20 mg daily. Checking CMP and CBC and adjust as needed. Follow up 2-4 weeks.

## 2022-01-24 NOTE — Assessment & Plan Note (Signed)
Checking CMP.  

## 2022-01-24 NOTE — Progress Notes (Signed)
   Subjective:   Patient ID: Willie [name], male    DOB: 1980/07/27, 42 y.o.   MRN: [id number]  HPI The patient is here for physical with concerns. BP high and was unable to get refill of amlodipine easily so he just stopped this.  PMH, [facility], social history reviewed and updated  Review of Systems  Constitutional: Negative.   HENT: Negative.    Eyes: Negative.   Respiratory:  Negative for cough, chest tightness and shortness of breath.   Cardiovascular:  Negative for chest pain, palpitations and leg swelling.  Gastrointestinal:  Negative for abdominal distention, abdominal pain, constipation, diarrhea, nausea and vomiting.  Musculoskeletal: Negative.   Skin: Negative.   Neurological: Negative.   Psychiatric/Behavioral: Negative.      Objective:  Physical Exam Constitutional:      Appearance: He is well-developed.  HENT:     Head: Normocephalic and atraumatic.  Cardiovascular:     Rate and Rhythm: Normal rate and regular rhythm.  Pulmonary:     Effort: Pulmonary effort is normal. No respiratory distress.     Breath sounds: Normal breath sounds. No wheezing or rales.  Abdominal:     General: Bowel sounds are normal. There is no distension.     Palpations: Abdomen is soft.     Tenderness: There is no abdominal tenderness. There is no rebound.  Musculoskeletal:     Cervical back: Normal range of motion.  Skin:    General: Skin is warm and dry.  Neurological:     Mental Status: He is alert and oriented to person, place, and time.     Coordination: Coordination normal.     Vitals:   01/24/22 0956 01/24/22 1000  BP: (!) 180/100 (!) 180/100  Pulse: 82   Temp: 98.3 F (36.8 C)   TempSrc: Oral   SpO2: 97%   Weight: 191 lb (86.6 kg)   Height: '5\' 7"'$  (1.702 m)     Assessment & Plan:

## 2022-01-24 NOTE — Assessment & Plan Note (Signed)
Flu shot up to date. Covid-19 counseled. Tetanus counseled. Counseled about sun safety and mole surveillance. Counseled about the dangers of distracted driving. Given 10 year screening recommendations.

## 2022-01-24 NOTE — Assessment & Plan Note (Signed)
Checking HgA1c today and adjust as needed. Previously normal.

## 2022-01-25 LAB — TESTOSTERONE TOTAL,FREE,BIO, MALES
Albumin: 4.9 g/dL (ref 3.6–5.1)
Sex Hormone Binding: 8 nmol/L — ABNORMAL LOW (ref 10–50)
Testosterone: 96 ng/dL — ABNORMAL LOW (ref 250–827)

## 2022-01-28 ENCOUNTER — Other Ambulatory Visit: Payer: Self-pay | Admitting: Internal Medicine

## 2022-01-28 DIAGNOSIS — R5383 Other fatigue: Secondary | ICD-10-CM

## 2022-03-12 ENCOUNTER — Other Ambulatory Visit (HOSPITAL_COMMUNITY): Payer: Self-pay

## 2022-03-12 ENCOUNTER — Other Ambulatory Visit: Payer: Self-pay

## 2022-03-13 ENCOUNTER — Other Ambulatory Visit: Payer: Self-pay

## 2022-03-14 ENCOUNTER — Other Ambulatory Visit (HOSPITAL_COMMUNITY): Payer: Self-pay

## 2022-04-16 ENCOUNTER — Other Ambulatory Visit (HOSPITAL_COMMUNITY): Payer: Self-pay

## 2022-05-08 ENCOUNTER — Other Ambulatory Visit (HOSPITAL_COMMUNITY): Payer: Self-pay

## 2022-05-08 MED ORDER — AMOXICILLIN 500 MG PO CAPS
500.0000 mg | ORAL_CAPSULE | Freq: Three times a day (TID) | ORAL | 0 refills | Status: DC
  Filled 2022-05-08: qty 30, 10d supply, fill #0

## 2022-06-09 ENCOUNTER — Other Ambulatory Visit (HOSPITAL_COMMUNITY): Payer: Self-pay

## 2022-06-09 ENCOUNTER — Other Ambulatory Visit: Payer: Self-pay | Admitting: Internal Medicine

## 2022-06-09 MED ORDER — LISINOPRIL 20 MG PO TABS
20.0000 mg | ORAL_TABLET | Freq: Every day | ORAL | 3 refills | Status: DC
  Filled 2022-06-09 (×2): qty 90, 90d supply, fill #0
  Filled 2022-09-03: qty 90, 90d supply, fill #1
  Filled 2022-12-03: qty 90, 90d supply, fill #2
  Filled 2023-03-02: qty 90, 90d supply, fill #3

## 2022-06-20 ENCOUNTER — Other Ambulatory Visit (HOSPITAL_COMMUNITY): Payer: Self-pay

## 2022-06-29 IMAGING — DX DG CHEST 2V
2 series · 2 of 2 positions shown · non-contrast
Comparison: None.

CLINICAL DATA: Chills and tachycardia

EXAM:
CHEST - 2 VIEW

[chest pa]
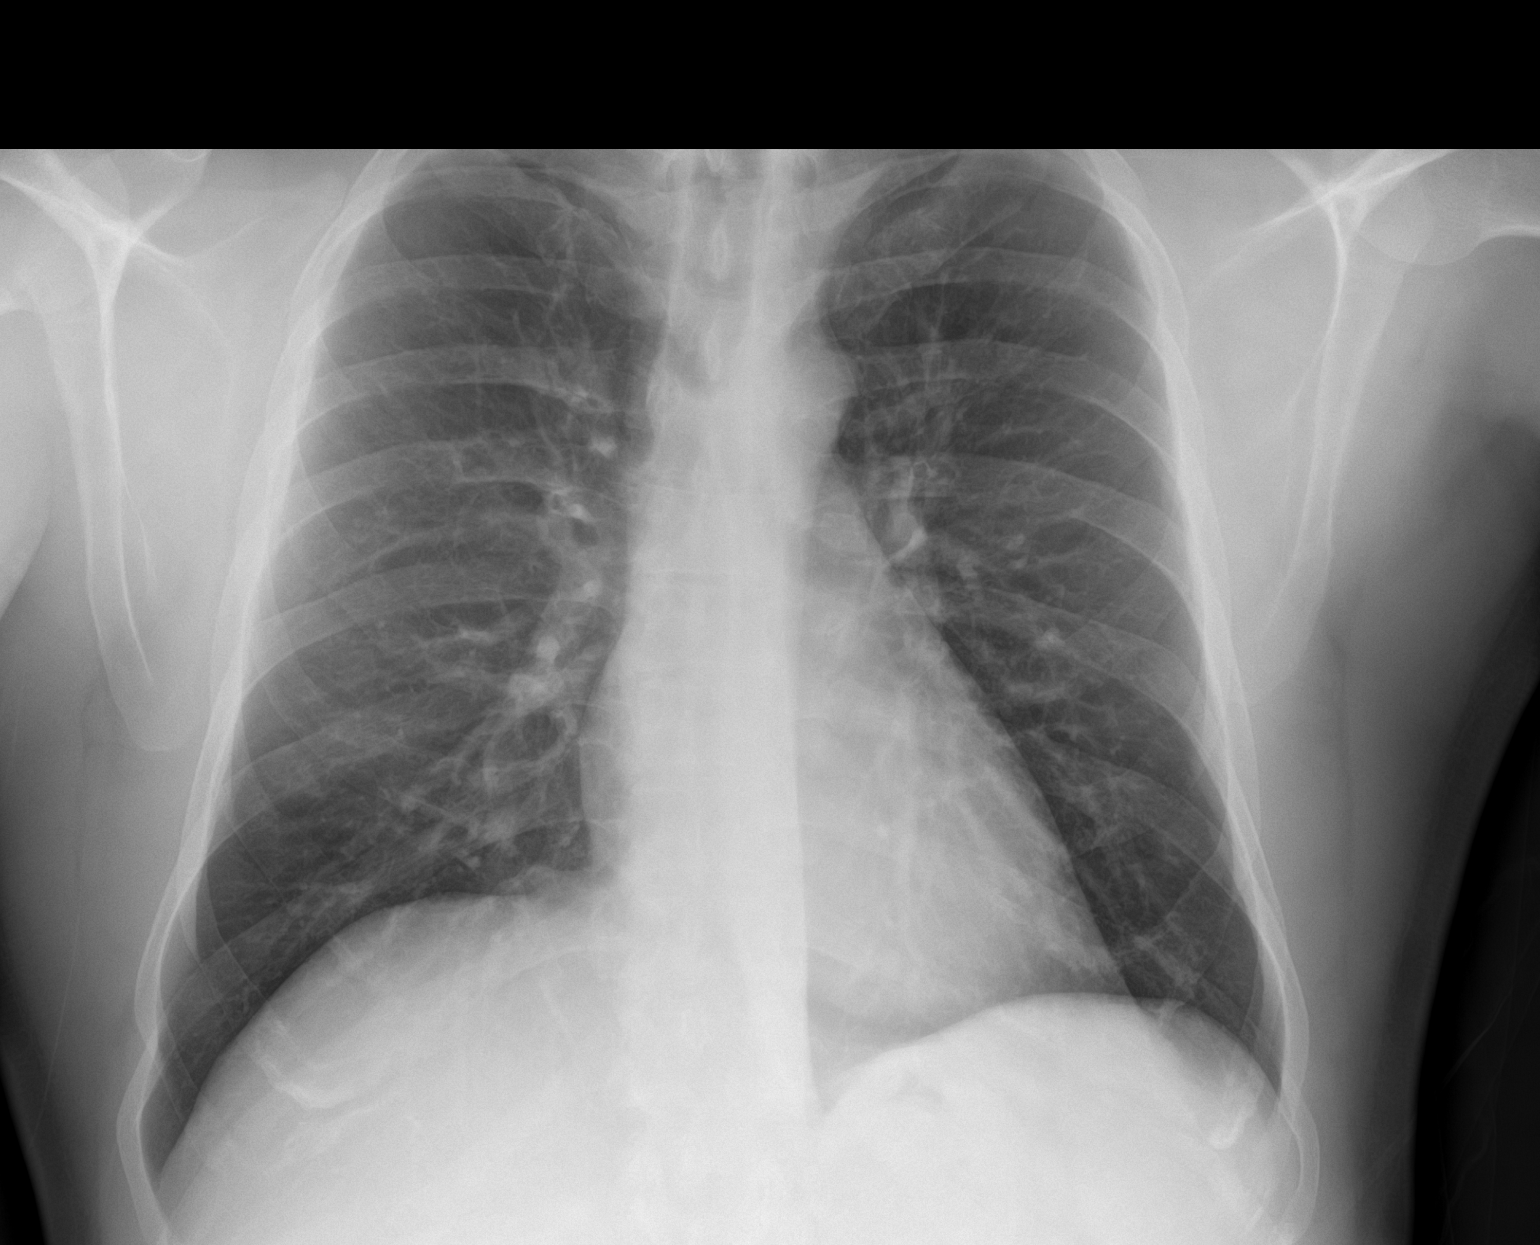

[chest lat]
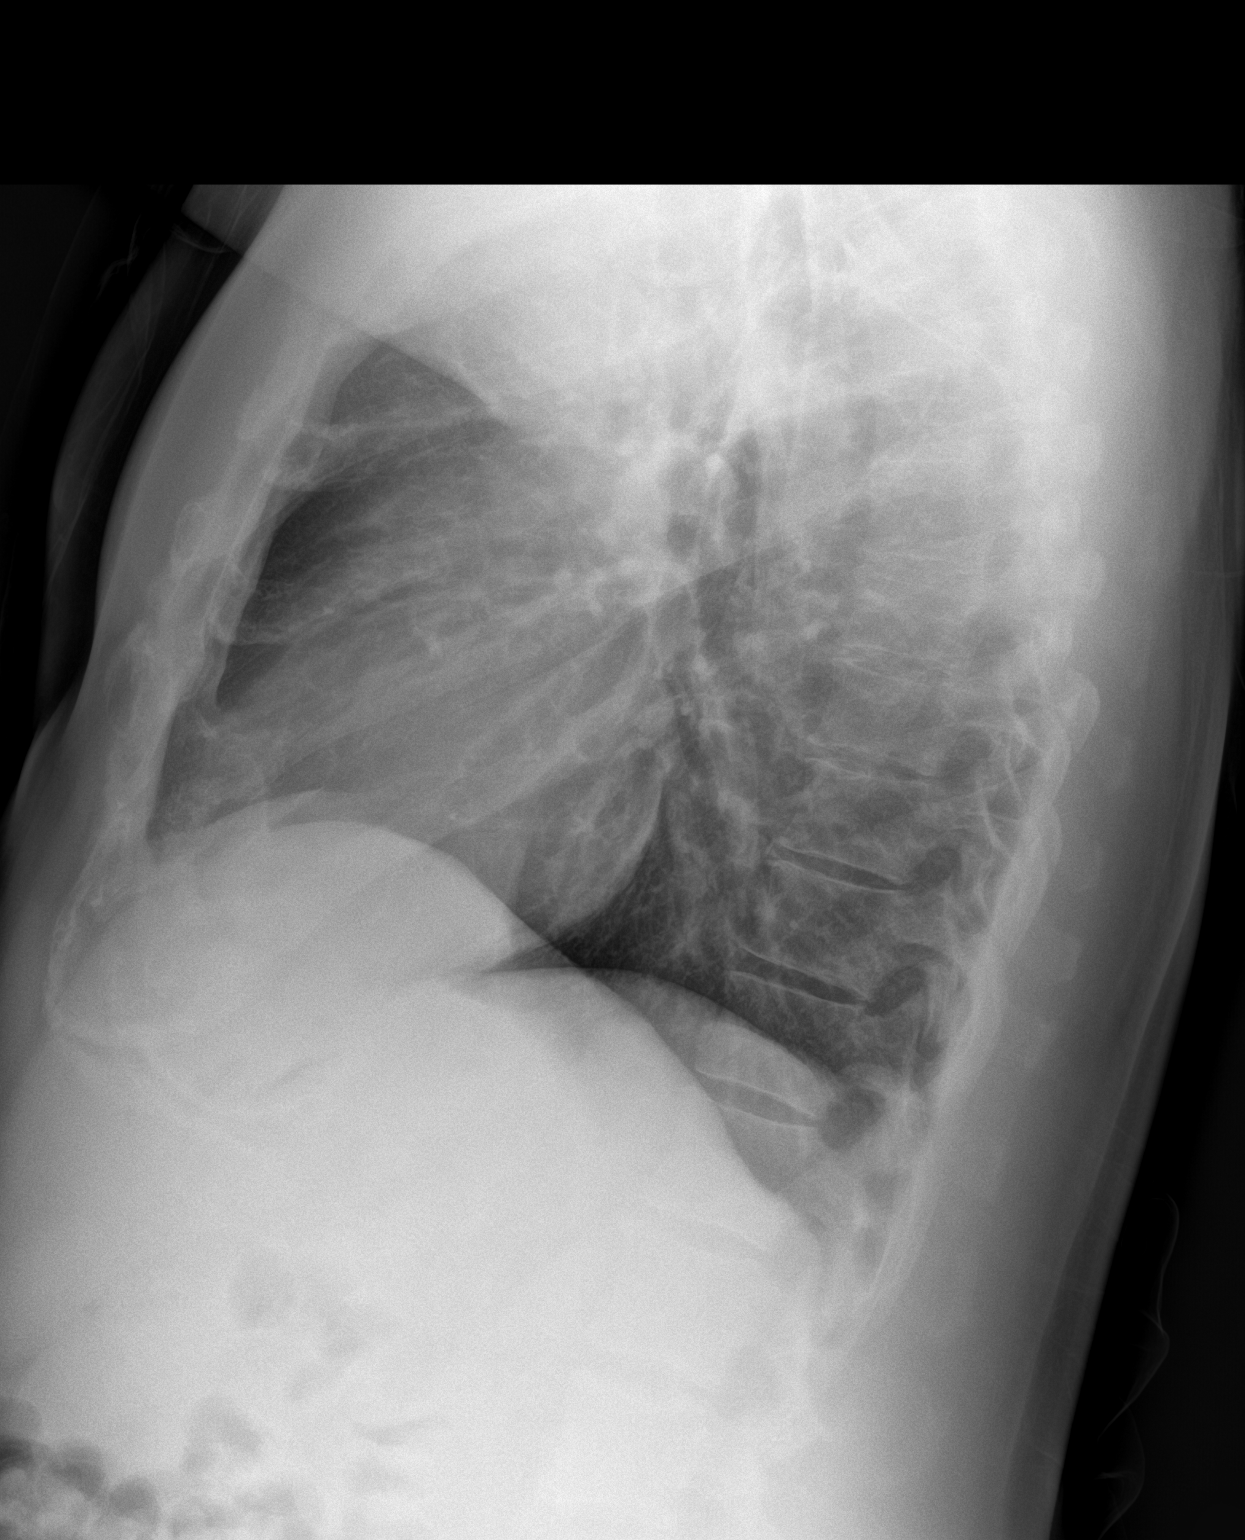

[2 of 2 positions shown; findings below may reference images not displayed]

FINDINGS: Normal heart size and mediastinal contours. No acute infiltrate or
edema. No effusion or pneumothorax. No acute osseous findings.
IMPRESSION: No active cardiopulmonary disease.

## 2022-07-21 ENCOUNTER — Other Ambulatory Visit (HOSPITAL_COMMUNITY): Payer: Self-pay

## 2022-12-04 ENCOUNTER — Other Ambulatory Visit (HOSPITAL_COMMUNITY): Payer: Self-pay

## 2023-01-10 ENCOUNTER — Other Ambulatory Visit: Payer: Self-pay | Admitting: Internal Medicine

## 2023-01-12 ENCOUNTER — Other Ambulatory Visit (HOSPITAL_COMMUNITY): Payer: Self-pay

## 2023-01-12 MED ORDER — AMLODIPINE BESYLATE 5 MG PO TABS
5.0000 mg | ORAL_TABLET | Freq: Every day | ORAL | 3 refills | Status: DC
  Filled 2023-01-12: qty 90, 90d supply, fill #0
  Filled 2023-04-13: qty 90, 90d supply, fill #1
  Filled 2023-06-19 – 2023-06-29 (×2): qty 90, 90d supply, fill #2
  Filled 2023-10-05: qty 90, 90d supply, fill #3

## 2023-03-24 ENCOUNTER — Telehealth: Payer: Self-pay | Admitting: Internal Medicine

## 2023-03-24 NOTE — Telephone Encounter (Signed)
 Copied from CRM [id number]. Topic: Clinical - Request for Lab/Test Order >> Mar 24, 2023  3:03 PM Alphonzo Lemmings O wrote: Reason for CRM: patient is requesting a lab for Willie [name] . Scheduled appointment for march 19th at 8:40

## 2023-03-25 NOTE — Telephone Encounter (Signed)
 FYI

## 2023-03-26 ENCOUNTER — Encounter: Payer: Self-pay | Admitting: Internal Medicine

## 2023-04-08 ENCOUNTER — Ambulatory Visit (INDEPENDENT_AMBULATORY_CARE_PROVIDER_SITE_OTHER): Payer: Self-pay | Admitting: Internal Medicine

## 2023-04-08 ENCOUNTER — Encounter: Payer: Self-pay | Admitting: Internal Medicine

## 2023-04-08 VITALS — BP 138/80 | HR 84 | Temp 98.1°F | Ht 67.0 in | Wt 183.0 lb

## 2023-04-08 DIAGNOSIS — Z23 Encounter for immunization: Secondary | ICD-10-CM

## 2023-04-08 DIAGNOSIS — M109 Gout, unspecified: Secondary | ICD-10-CM

## 2023-04-08 DIAGNOSIS — Z0000 Encounter for general adult medical examination without abnormal findings: Secondary | ICD-10-CM | POA: Diagnosis not present

## 2023-04-08 DIAGNOSIS — E559 Vitamin D deficiency, unspecified: Secondary | ICD-10-CM | POA: Diagnosis not present

## 2023-04-08 DIAGNOSIS — R739 Hyperglycemia, unspecified: Secondary | ICD-10-CM | POA: Diagnosis not present

## 2023-04-08 DIAGNOSIS — I10 Essential (primary) hypertension: Secondary | ICD-10-CM

## 2023-04-08 DIAGNOSIS — R5383 Other fatigue: Secondary | ICD-10-CM

## 2023-04-08 LAB — COMPREHENSIVE METABOLIC PANEL
ALT: 34 U/L (ref 0–53)
AST: 27 U/L (ref 0–37)
Albumin: 4.9 g/dL (ref 3.5–5.2)
Alkaline Phosphatase: 92 U/L (ref 39–117)
BUN: 18 mg/dL (ref 6–23)
CO2: 30 meq/L (ref 19–32)
Calcium: 9.8 mg/dL (ref 8.4–10.5)
Chloride: 101 meq/L (ref 96–112)
Creatinine, Ser: 1.1 mg/dL (ref 0.40–1.50)
GFR: 82.49 mL/min (ref >60.00)
Glucose, Bld: 112 mg/dL — ABNORMAL HIGH (ref 70–99)
Potassium: 3.8 meq/L (ref 3.5–5.1)
Sodium: 140 meq/L (ref 135–145)
Total Bilirubin: 1.6 mg/dL — ABNORMAL HIGH (ref 0.2–1.2)
Total Protein: 7.9 g/dL (ref 6.0–8.3)

## 2023-04-08 LAB — LIPID PANEL
Cholesterol: 213 mg/dL — ABNORMAL HIGH (ref 0–200)
HDL: 35.4 mg/dL — ABNORMAL LOW (ref >39.00)
LDL Cholesterol: 104 mg/dL — ABNORMAL HIGH (ref 0–99)
NonHDL: 177.52
Total CHOL/HDL Ratio: 6
Triglycerides: 367 mg/dL — ABNORMAL HIGH (ref 0.0–149.0)
VLDL: 73.4 mg/dL — ABNORMAL HIGH (ref 0.0–40.0)

## 2023-04-08 LAB — CBC
HCT: 47.5 % (ref 39.0–52.0)
Hemoglobin: 16.2 g/dL (ref 13.0–17.0)
MCHC: 34.1 g/dL (ref 30.0–36.0)
MCV: 87.2 fl (ref 78.0–100.0)
Platelets: 214 10*3/uL (ref 150.0–400.0)
RBC: 5.44 Mil/uL (ref 4.22–5.81)
RDW: 13 % (ref 11.5–15.5)
WBC: 8 10*3/uL (ref 4.0–10.5)

## 2023-04-08 LAB — HEMOGLOBIN A1C: Hgb A1c MFr Bld: 5.6 % (ref 4.6–6.5)

## 2023-04-08 LAB — TSH: TSH: 1.26 u[IU]/mL (ref 0.35–5.50)

## 2023-04-08 LAB — VITAMIN D 25 HYDROXY (VIT D DEFICIENCY, FRACTURES): VITD: 17.94 ng/mL — ABNORMAL LOW (ref 30.00–100.00)

## 2023-04-08 LAB — VITAMIN B12: Vitamin B-12: 361 pg/mL (ref 211–911)

## 2023-04-08 LAB — URIC ACID: Uric Acid, Serum: 9.1 mg/dL — ABNORMAL HIGH (ref 4.0–7.8)

## 2023-04-08 NOTE — Assessment & Plan Note (Signed)
 Checking vitamin D and adjust as needed.

## 2023-04-08 NOTE — Assessment & Plan Note (Signed)
 Checking CBC and CMP and lipid panel. Adjust as needed lisinopril and amlodipine. BP At goal.

## 2023-04-08 NOTE — Assessment & Plan Note (Signed)
 Flu shot up to date. Tetanus given. Hep a 1st given. Counseled about sun safety and mole surveillance. Counseled about the dangers of distracted driving. Given 10 year screening recommendations.

## 2023-04-08 NOTE — Progress Notes (Signed)
   Subjective:   Patient ID: Willie [name], male    DOB: 1980/06/19, 43 y.o.   MRN: [id number]  HPI The patient is a 43 YO man coming in for physical with stable fatigue.  PMH, [facility], social history reviewed and updated  Review of Systems  Constitutional:  Positive for fatigue.  HENT: Negative.    Eyes: Negative.   Respiratory:  Negative for cough, chest tightness and shortness of breath.   Cardiovascular:  Negative for chest pain, palpitations and leg swelling.  Gastrointestinal:  Negative for abdominal distention, abdominal pain, constipation, diarrhea, nausea and vomiting.  Musculoskeletal: Negative.   Skin: Negative.   Neurological: Negative.   Psychiatric/Behavioral: Negative.      Objective:  Physical Exam Constitutional:      Appearance: He is well-developed.  HENT:     Head: Normocephalic and atraumatic.  Cardiovascular:     Rate and Rhythm: Normal rate and regular rhythm.  Pulmonary:     Effort: Pulmonary effort is normal. No respiratory distress.     Breath sounds: Normal breath sounds. No wheezing or rales.  Abdominal:     General: Bowel sounds are normal. There is no distension.     Palpations: Abdomen is soft.     Tenderness: There is no abdominal tenderness. There is no rebound.  Musculoskeletal:     Cervical back: Normal range of motion.  Skin:    General: Skin is warm and dry.  Neurological:     Mental Status: He is alert and oriented to person, place, and time.     Coordination: Coordination normal.     Vitals:   04/08/23 0810  BP: 138/80  Pulse: 84  Temp: 98.1 F (36.7 C)  TempSrc: Oral  SpO2: 99%  Weight: 183 lb (83 kg)  Height: 5\' 7"  (1.702 m)    Assessment & Plan:  Tdap and hep a given at visit

## 2023-04-08 NOTE — Assessment & Plan Note (Signed)
 Checking CMP.

## 2023-04-08 NOTE — Assessment & Plan Note (Signed)
 Checking testosterone free and total today. He had low levels last year. Has worked on diet and exercise and lost 10 pounds which may have helped.

## 2023-04-08 NOTE — Assessment & Plan Note (Signed)
Checking uric acid.

## 2023-04-08 NOTE — Assessment & Plan Note (Signed)
 Checking HGA1c.

## 2023-04-13 ENCOUNTER — Encounter: Payer: Self-pay | Admitting: Internal Medicine

## 2023-04-14 ENCOUNTER — Other Ambulatory Visit (HOSPITAL_COMMUNITY): Payer: Self-pay

## 2023-04-14 LAB — TESTOSTERONE, F EQLIB+T LC/MS
Testosterone, Free Pct: 5.11 % — ABNORMAL HIGH (ref 1.50–4.20)
Testosterone, Free: 12.05 ng/dL (ref 5.00–21.00)
Testosterone, Total, LC/MS: 235.9 ng/dL — ABNORMAL LOW (ref 264.0–916.0)

## 2023-04-14 MED ORDER — AZITHROMYCIN 500 MG PO TABS
ORAL_TABLET | ORAL | 0 refills | Status: DC
  Filled 2023-04-14: qty 4, 3d supply, fill #0

## 2023-04-14 MED ORDER — ATOVAQUONE-PROGUANIL HCL 250-100 MG PO TABS
ORAL_TABLET | ORAL | 0 refills | Status: DC
  Filled 2023-04-14: qty 32, 32d supply, fill #0

## 2023-05-19 ENCOUNTER — Ambulatory Visit: Admitting: Internal Medicine

## 2023-05-26 ENCOUNTER — Other Ambulatory Visit: Payer: Self-pay | Admitting: Internal Medicine

## 2023-05-27 ENCOUNTER — Other Ambulatory Visit: Payer: Self-pay

## 2023-05-27 ENCOUNTER — Other Ambulatory Visit (HOSPITAL_COMMUNITY): Payer: Self-pay

## 2023-05-27 MED ORDER — LISINOPRIL 20 MG PO TABS
20.0000 mg | ORAL_TABLET | Freq: Every day | ORAL | 3 refills | Status: AC
  Filled 2023-05-27: qty 90, 90d supply, fill #0
  Filled 2023-08-26: qty 90, 90d supply, fill #1
  Filled 2023-11-24: qty 90, 90d supply, fill #2
  Filled 2024-02-18 (×2): qty 90, 90d supply, fill #3

## 2023-06-19 ENCOUNTER — Other Ambulatory Visit (HOSPITAL_COMMUNITY): Payer: Self-pay

## 2023-06-23 ENCOUNTER — Other Ambulatory Visit (HOSPITAL_COMMUNITY): Payer: Self-pay

## 2023-06-23 MED ORDER — AMOXICILLIN 500 MG PO CAPS
500.0000 mg | ORAL_CAPSULE | Freq: Three times a day (TID) | ORAL | 0 refills | Status: DC
  Filled 2023-06-23: qty 30, 10d supply, fill #0

## 2023-06-29 ENCOUNTER — Other Ambulatory Visit (HOSPITAL_COMMUNITY): Payer: Self-pay

## 2024-01-04 ENCOUNTER — Other Ambulatory Visit: Payer: Self-pay | Admitting: Internal Medicine

## 2024-01-04 ENCOUNTER — Other Ambulatory Visit: Payer: Self-pay

## 2024-01-04 ENCOUNTER — Other Ambulatory Visit (HOSPITAL_COMMUNITY): Payer: Self-pay

## 2024-01-04 MED ORDER — AMLODIPINE BESYLATE 5 MG PO TABS
5.0000 mg | ORAL_TABLET | Freq: Every day | ORAL | 3 refills | Status: AC
  Filled 2024-01-04: qty 90, 90d supply, fill #0

## 2024-02-01 ENCOUNTER — Ambulatory Visit: Admitting: Internal Medicine

## 2024-02-01 ENCOUNTER — Other Ambulatory Visit (HOSPITAL_COMMUNITY): Payer: Self-pay

## 2024-02-01 ENCOUNTER — Encounter: Payer: Self-pay | Admitting: Internal Medicine

## 2024-02-01 VITALS — BP 116/77 | HR 111 | Temp 98.2°F | Ht 67.0 in | Wt 183.6 lb

## 2024-02-01 DIAGNOSIS — R739 Hyperglycemia, unspecified: Secondary | ICD-10-CM

## 2024-02-01 DIAGNOSIS — E559 Vitamin D deficiency, unspecified: Secondary | ICD-10-CM | POA: Diagnosis not present

## 2024-02-01 DIAGNOSIS — E781 Pure hyperglyceridemia: Secondary | ICD-10-CM | POA: Insufficient documentation

## 2024-02-01 DIAGNOSIS — R5383 Other fatigue: Secondary | ICD-10-CM

## 2024-02-01 DIAGNOSIS — I10 Essential (primary) hypertension: Secondary | ICD-10-CM

## 2024-02-01 LAB — CBC
HCT: 50.4 % (ref 39.0–52.0)
Hemoglobin: 17.6 g/dL — ABNORMAL HIGH (ref 13.0–17.0)
MCHC: 34.9 g/dL (ref 30.0–36.0)
MCV: 85 fl (ref 78.0–100.0)
Platelets: 266 K/uL (ref 150.0–400.0)
RBC: 5.93 Mil/uL — ABNORMAL HIGH (ref 4.22–5.81)
RDW: 13 % (ref 11.5–15.5)
WBC: 9.4 K/uL (ref 4.0–10.5)

## 2024-02-01 LAB — COMPREHENSIVE METABOLIC PANEL WITH GFR
ALT: 23 U/L (ref 3–53)
AST: 21 U/L (ref 5–37)
Albumin: 5.1 g/dL (ref 3.5–5.2)
Alkaline Phosphatase: 85 U/L (ref 39–117)
BUN: 18 mg/dL (ref 6–23)
CO2: 30 meq/L (ref 19–32)
Calcium: 9.8 mg/dL (ref 8.4–10.5)
Chloride: 100 meq/L (ref 96–112)
Creatinine, Ser: 1.19 mg/dL (ref 0.40–1.50)
GFR: 74.63 mL/min
Glucose, Bld: 124 mg/dL — ABNORMAL HIGH (ref 70–99)
Potassium: 3.8 meq/L (ref 3.5–5.1)
Sodium: 140 meq/L (ref 135–145)
Total Bilirubin: 1.2 mg/dL (ref 0.2–1.2)
Total Protein: 8.5 g/dL — ABNORMAL HIGH (ref 6.0–8.3)

## 2024-02-01 LAB — HEMOGLOBIN A1C: Hgb A1c MFr Bld: 5.5 % (ref 4.6–6.5)

## 2024-02-01 LAB — LIPID PANEL
Cholesterol: 229 mg/dL — ABNORMAL HIGH (ref 28–200)
HDL: 40.6 mg/dL
LDL Cholesterol: 145 mg/dL — ABNORMAL HIGH (ref 10–99)
NonHDL: 188.19
Total CHOL/HDL Ratio: 6
Triglycerides: 218 mg/dL — ABNORMAL HIGH (ref 10.0–149.0)
VLDL: 43.6 mg/dL — ABNORMAL HIGH (ref 0.0–40.0)

## 2024-02-01 LAB — VITAMIN B12: Vitamin B-12: 496 pg/mL (ref 211–911)

## 2024-02-01 LAB — VITAMIN D 25 HYDROXY (VIT D DEFICIENCY, FRACTURES): VITD: 23.66 ng/mL — ABNORMAL LOW (ref 30.00–100.00)

## 2024-02-01 MED ORDER — AZITHROMYCIN 500 MG PO TABS
ORAL_TABLET | ORAL | 0 refills | Status: AC
  Filled 2024-02-01: qty 4, 3d supply, fill #0

## 2024-02-01 NOTE — Patient Instructions (Signed)
 We will check the labs and will send in the azithromycin  to take with you.

## 2024-02-01 NOTE — Progress Notes (Signed)
 "  Subjective:   Patient ID: Willie [name], male    DOB: 10-29-1980, 44 y.o.   MRN: [id number]  Discussed the use of AI scribe software for clinical note transcription with the patient, who gave verbal consent to proceed.  History of Present Illness Willie [name] is a 44 year old male who presents for pre-travel evaluation and management of lightheadedness and high triglycerides.  He is preparing to [place] in late [place] for two and a half weeks and seeks a pre-travel evaluation, including medication for traveler's diarrhea.  He experiences intermittent lightheadedness over the past few weeks, with episodes lasting from minutes to days. The dizziness often resolves with physical activity, such as walking at the gym, but can return when he is sedentary. No specific dietary changes correlate with these episodes, although he has improved his diet by reducing fast food and increasing intake of rice, beans, chicken, and fish. He has also reduced his coffee, sugar, and salt intake. He wonders if the dizziness could be related to an electrolyte imbalance, vitamin deficiency, or possibly an eyesight issue, as he has never had an eye exam. No syncope has occurred.  He has a history of high triglycerides, consistently elevated in the 200s, once reaching 300. He has increased physical activity, going to the gym and walking more, to lower his triglycerides. His father also had high triglycerides, suggesting a possible genetic component. He has been consuming protein shakes and wonders if this could be affecting his kidneys, as a previous specialist found no issues.  Review of Systems  Constitutional: Negative.   HENT: Negative.    Eyes: Negative.   Respiratory:  Negative for cough, chest tightness and shortness of breath.   Cardiovascular:  Negative for chest pain, palpitations and leg swelling.  Gastrointestinal:  Negative for abdominal distention, abdominal pain, constipation,  diarrhea, nausea and vomiting.  Musculoskeletal: Negative.   Skin: Negative.   Neurological:  Positive for light-headedness.  Psychiatric/Behavioral: Negative.      Objective:  Physical Exam Constitutional:      Appearance: He is well-developed.  HENT:     Head: Normocephalic and atraumatic.  Cardiovascular:     Rate and Rhythm: Normal rate and regular rhythm.  Pulmonary:     Effort: Pulmonary effort is normal. No respiratory distress.     Breath sounds: Normal breath sounds. No wheezing or rales.  Abdominal:     General: Bowel sounds are normal. There is no distension.     Palpations: Abdomen is soft.     Tenderness: There is no abdominal tenderness.  Musculoskeletal:     Cervical back: Normal range of motion.  Skin:    General: Skin is warm and dry.  Neurological:     Mental Status: He is alert and oriented to person, place, and time.     Coordination: Coordination normal.     Vitals:   02/01/24 1429 02/01/24 1430  BP: (!) 150/83 116/77  Pulse: (!) 111   Temp: 98.2 F (36.8 C)   SpO2: 98%   Weight: 183 lb 9.6 oz (83.3 kg)   Height: 5' 7 (1.702 m)     Assessment and Plan Assessment & Plan Other fatigue   Symptoms are intermittent and may result from autonomic dysregulation, electrolyte imbalance, vitamin deficiency, or vision issues. Recent dietary changes and increased exercise could be contributing factors. Blood tests have been ordered to assess vitamin deficiencies and electrolyte imbalances. An eye examination is recommended to rule out vision-related causes. He is  encouraged to continue regular exercise.  Hyperglycemia Checking HgA1c for any change.   Essential hypertension BP at goal today. Checking CMP and adjust regimen as needed.   Pure hypertriglyceridemia   Triglyceride levels are around 250 mg/dL with occasional spikes above 300 mg/dL. There is a family history of this condition, but no current cardiovascular risk factors. Monitoring triglyceride  levels is appropriate, and statins will be considered if cardiovascular risk increases. Lifestyle modifications, including diet and exercise, are encouraged. Checking lipid panel today.   Vitamin D  deficiency   Blood tests have been ordered to check for vitamin ddeficiency.   "

## 2024-02-08 ENCOUNTER — Ambulatory Visit: Payer: Self-pay | Admitting: Internal Medicine

## 2024-02-10 ENCOUNTER — Other Ambulatory Visit (HOSPITAL_COMMUNITY): Payer: Self-pay

## 2024-02-10 MED ORDER — ROSUVASTATIN CALCIUM 20 MG PO TABS
20.0000 mg | ORAL_TABLET | Freq: Every day | ORAL | 3 refills | Status: AC
  Filled 2024-02-10: qty 90, 90d supply, fill #0

## 2024-02-16 ENCOUNTER — Other Ambulatory Visit (HOSPITAL_COMMUNITY): Payer: Self-pay

## 2024-02-16 ENCOUNTER — Telehealth

## 2024-02-16 DIAGNOSIS — J069 Acute upper respiratory infection, unspecified: Secondary | ICD-10-CM

## 2024-02-16 MED ORDER — AZELASTINE HCL 0.1 % NA SOLN
2.0000 | Freq: Two times a day (BID) | NASAL | 0 refills | Status: AC
  Filled 2024-02-16: qty 30, 50d supply, fill #0

## 2024-02-16 MED ORDER — BENZONATATE 100 MG PO CAPS
100.0000 mg | ORAL_CAPSULE | Freq: Two times a day (BID) | ORAL | 0 refills | Status: AC | PRN
  Filled 2024-02-16: qty 20, 10d supply, fill #0

## 2024-02-16 NOTE — Progress Notes (Signed)
 " Virtual Visit Consent   Willie [name], you are scheduled for a virtual visit with a [facility] provider today. Just as with appointments in the office, your consent must be obtained to participate. Your consent will be active for this visit and any virtual visit you may have with one of our providers in the next 365 days. If you have a MyChart account, a copy of this consent can be sent to you electronically.  As this is a virtual visit, video technology does not allow for your provider to perform a traditional examination. This may limit your provider's ability to fully assess your condition. If your provider identifies any concerns that need to be evaluated in person or the need to arrange testing (such as labs, EKG, etc.), we will make arrangements to do so. Although advances in technology are sophisticated, we cannot ensure that it will always work on either your end or our end. If the connection with a video visit is poor, the visit may have to be switched to a telephone visit. With either a video or telephone visit, we are not always able to ensure that we have a secure connection.  By engaging in this virtual visit, you consent to the provision of healthcare and authorize for your insurance to be billed (if applicable) for the services provided during this visit. Depending on your insurance coverage, you may receive a charge related to this service.  I need to obtain your verbal consent now. Are you willing to proceed with your visit today? Willie [name] has provided verbal consent on 02/16/2024 for a virtual visit (video or telephone). [name], NP  Date: 02/16/2024 10:33 AM   Virtual Visit via Video Note   I, [name], connected with  Willie [name]  ([id number], 07-23-1980) on 02/16/24 at 10:30 AM EST by a video-enabled telemedicine application and verified that I am speaking with the correct person using two identifiers.  Location: Patient: Virtual Visit  Location Patient: Home Provider: Virtual Visit Location Provider: Home Office   I discussed the limitations of evaluation and management by telemedicine and the availability of in person appointments. The patient expressed understanding and agreed to proceed.    History of Present Illness: Willie [name] is a 44 y.o. who identifies as a male who was assigned male at birth, and is being seen today for cough.  Cold sx, really bad cough, big coughing fits especially at night if lying down. Voice is hoarse. Started 02/12/24- had temp 99.77F, chills. No nasal congestion, no post nasal drip. Cough is mostly dry cough 80% of time. If he coughs anythign up, it is clear. No SOB or wheezing. Non smoker, no hx asthma or COPD.   Tried cough drops, delsym, Vicks nyquil and vaporub, Emergen-C.   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  Resting comfortably  at home.  Head is normocephalic, atraumatic.  No labored breathing.  Speech is clear and coherent with  logical content.  Patient is alert and oriented at baseline.    Assessment and Plan: 1. Viral URI with cough (Primary) - azelastine  (ASTELIN ) 0.1 % nasal spray; Place 2 sprays into both nostrils 2 (two) times daily. Use in each nostril as directed  Dispense: 30 mL; Refill: 0 - benzonatate  (TESSALON ) 100 MG capsule; Take 1 capsule (100  mg total) by mouth 2 (two) times daily as needed for cough.  Dispense: 20 capsule; Refill: 0  Based on how he describes his symptoms, I suspct he does have nasal/head congestion but it is draining down his throat triggering a cough instead of draining from nose. Will try treating symptomatically. I dod not supset pna or bronchitits at this time.   Follow Up Instructions: I discussed the assessment and treatment plan with the patient. The patient was provided an opportunity to ask questions and all were answered. The patient agreed with the plan and demonstrated an understanding of the instructions.  A copy of instructions were sent to the patient via MyChart unless otherwise noted below.   The patient was advised to call back or seek an in-person evaluation if the symptoms worsen or if the condition fails to improve as anticipated.    [name], NP    [1] No Known Allergies [2]  Current Outpatient Medications:    azelastine  (ASTELIN ) 0.1 % nasal spray, Place 2 sprays into both nostrils 2 (two) times daily. Use in each nostril as directed, Disp: 30 mL, Rfl: 0   benzonatate  (TESSALON ) 100 MG capsule, Take 1 capsule (100 mg total) by mouth 2 (two) times daily as needed for cough., Disp: 20 capsule, Rfl: 0   amLODipine  (NORVASC ) 5 MG tablet, Take 1 tablet (5 mg total) by mouth daily., Disp: 90 tablet, Rfl: 3   azithromycin  (ZITHROMAX ) 500 MG tablet, For nonbloody diarrhea, take 2 tablets on day 1. If resolved, stop medication. If diarrhea persists, take 1 tab on day 2 and 3. For bloody diarrhea, take 2 tabs on day 1 and 1 tab on day 2 and 3, Disp: 4 tablet, Rfl: 0   lisinopril  (ZESTRIL ) 20 MG tablet, Take 1 tablet (20 mg total) by mouth daily., Disp: 90 tablet, Rfl: 3   rosuvastatin  (CRESTOR ) 20 MG tablet, Take 1 tablet (20 mg total) by mouth daily., Disp: 90 tablet, Rfl: 3  "

## 2024-02-16 NOTE — Patient Instructions (Signed)
 " [name], thank you for joining [name], NP for today's virtual visit.  While this provider is not your primary care provider (PCP), if your PCP is located in our provider database this encounter information will be shared with them immediately following your visit.   A [facility] MyChart account gives you access to today's visit and all your visits, tests, and labs performed at [facility]  click here if you don't have a [facility] MyChart account or go to [email or web address]  Consent: (Patient) Willie [name] provided verbal consent for this virtual visit at the beginning of the encounter.  Current Medications:  Current Outpatient Medications:    azelastine  (ASTELIN ) 0.1 % nasal spray, Place 2 sprays into both nostrils 2 (two) times daily. Use in each nostril as directed, Disp: 30 mL, Rfl: 0   benzonatate  (TESSALON ) 100 MG capsule, Take 1 capsule (100 mg total) by mouth 2 (two) times daily as needed for cough., Disp: 20 capsule, Rfl: 0   amLODipine  (NORVASC ) 5 MG tablet, Take 1 tablet (5 mg total) by mouth daily., Disp: 90 tablet, Rfl: 3   azithromycin  (ZITHROMAX ) 500 MG tablet, For nonbloody diarrhea, take 2 tablets on day 1. If resolved, stop medication. If diarrhea persists, take 1 tab on day 2 and 3. For bloody diarrhea, take 2 tabs on day 1 and 1 tab on day 2 and 3, Disp: 4 tablet, Rfl: 0   lisinopril  (ZESTRIL ) 20 MG tablet, Take 1 tablet (20 mg total) by mouth daily., Disp: 90 tablet, Rfl: 3   rosuvastatin  (CRESTOR ) 20 MG tablet, Take 1 tablet (20 mg total) by mouth daily., Disp: 90 tablet, Rfl: 3   Medications ordered in this encounter:  Meds ordered this encounter  Medications   azelastine  (ASTELIN ) 0.1 % nasal spray    Sig: Place 2 sprays into both nostrils 2 (two) times daily. Use in each nostril as directed    Dispense:  30 mL    Refill:  0   benzonatate  (TESSALON ) 100 MG capsule    Sig: Take 1 capsule (100 mg total) by mouth 2  (two) times daily as needed for cough.    Dispense:  20 capsule    Refill:  0     *If you need refills on other medications prior to your next appointment, please contact your pharmacy*  Follow-Up: Call back or seek an in-person evaluation if the symptoms worsen or if the condition fails to improve as anticipated.  [facility] [phone]  Other Instructions  I recommend taking Mucinex (ok to use generic guaifenesin) to help your congestion thin out and drain.  Also I have prescribed a different cough medicine to try and a nasal spray that I think will help.     If you have been instructed to have an in-person evaluation today at a local Urgent Care facility, please use the link below. It will take you to a list of all of our available [facility], including address, phone number and hours of operation. Please do not delay care.  [facility]  If you or a family member do not have a primary care provider, use the link below to schedule a visit and establish care. When you choose a [facility] primary care physician or advanced practice provider, you gain a long-term partner in health. Find a Primary Care Provider  Learn more about [facility]'s in-office and virtual care options: [facility] - Get Care  Now  "

## 2024-02-18 ENCOUNTER — Other Ambulatory Visit: Payer: Self-pay

## 2024-02-19 ENCOUNTER — Ambulatory Visit (HOSPITAL_BASED_OUTPATIENT_CLINIC_OR_DEPARTMENT_OTHER): Admission: EM | Admit: 2024-02-19 | Discharge: 2024-02-19 | Disposition: A | Discharge status: Home / Self Care

## 2024-02-19 ENCOUNTER — Encounter (HOSPITAL_BASED_OUTPATIENT_CLINIC_OR_DEPARTMENT_OTHER): Payer: Self-pay

## 2024-02-19 ENCOUNTER — Other Ambulatory Visit: Payer: Self-pay

## 2024-02-19 DIAGNOSIS — I10 Essential (primary) hypertension: Secondary | ICD-10-CM

## 2024-02-19 DIAGNOSIS — J069 Acute upper respiratory infection, unspecified: Secondary | ICD-10-CM | POA: Diagnosis not present

## 2024-02-19 NOTE — Discharge Instructions (Signed)
 Your blood pressure was elevated during todays visit but got better on recheck. You had some dizziness earlier this afternoon which has since resolved and you are not experiencing any symptoms at this time. It's possible that your elevated blood pressure was from the cough medicine you've been taking for your respiratory illness. Consider using Coricidin HBP products for your cough/cold symptoms as they do not have ingredients in them that can raise your blood pressure. Reducing your salt intake, exercising regularly, limiting alcohol consumption, and continue to not smoke are all essential in managing your blood pressure and preventing complications. Please monitor your blood pressure regularly, and if you experience any symptoms such as dizziness, chest pain, or severe headaches, seek medical attention immediately. Follow up with your primary care provider to monitor your blood pressure and make any necessary adjustments to your treatment plan.

## 2024-02-19 NOTE — ED Triage Notes (Signed)
 Pt is here with HBP and dizziness that started this morning, pt is recovering from a cold and has been taken OTC meds to relieve discomfort.

## 2024-02-19 NOTE — ED Provider Notes (Signed)
 " [facility]    CSN: [id number] Arrival date & time: 02/19/24  1815      History   Chief Complaint Chief Complaint  Patient presents with   Hypertension    HPI Willie [name] is a 44 y.o. male.   The patient presents for evaluation of acute dizziness in the setting of a recent upper respiratory illness. Symptoms began on 02/12/24 with cold-like symptoms including chills, a low-grade temperature of 99.51F, hoarseness, and a significant cough. The cough has been described as mostly dry with occasional clear sputum production. He reports frequent coughing fits, particularly at night when lying down. He denies nasal congestion, postnasal drip, shortness of breath, or wheezing. The patient had a televisit on 1/27 and was advised to take Mucinex for symptom management. Today, he took two doses of the 12-hour formulation, one in the morning and another around 3-4 PM.  At approximately 5:30 PM, the patient experienced an episode of acute dizziness that lasted about 60-90 minutes and has since resolved. He reports feeling improved at the time of evaluation. He denies chest pain, shortness of breath, or other associated neurologic symptoms. His medical history is significant for hypertension, for which he takes amlodipine  and lisinopril , and he reports compliance with his medications. He is a nonsmoker and has no history of asthma or COPD. He was seen by his primary care provider on 1/12 for routine follow-up, at which time his blood pressure was at goal (116/77). Initial blood pressure on presentation today was 153/82, with repeat measurement improving to 135/91.  The following sections of the patient's history were reviewed and updated as appropriate: allergies, current medications, past family history, past medical history, past social history, past surgical history, and problem list.     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 No pertinent surgical history.     Home Medications    Prior to Admission medications  Medication Sig Start Date End Date Taking? Authorizing Provider  amLODipine  (NORVASC ) 5 MG tablet Take 1 tablet (5 mg total) by mouth daily. 01/04/24   Rollene Almarie LABOR, MD  azelastine  (ASTELIN ) 0.1 % nasal spray Place 2 sprays into both nostrils 2 (two) times daily as directed. 02/16/24   Richad Jon HERO, NP  azithromycin  (ZITHROMAX ) 500 MG tablet For nonbloody diarrhea, take 2 tablets on day 1. If resolved, stop medication. If diarrhea persists, take 1 tab on day 2 and 3. For bloody diarrhea, take 2 tabs on day 1 and 1 tab on day 2 and 3 02/01/24   Rollene Almarie LABOR, MD  benzonatate  (TESSALON ) 100 MG capsule Take 1 capsule (100 mg total) by mouth 2 (two) times daily as needed for cough. 02/16/24   Richad Jon HERO, NP  lisinopril  (ZESTRIL ) 20 MG tablet Take 1 tablet (20 mg total) by mouth daily. 05/27/23   Rollene Almarie LABOR, MD  rosuvastatin  (CRESTOR ) 20 MG tablet Take 1 tablet (20 mg total) by mouth daily. 02/10/24   Rollene Almarie LABOR, MD  Family History Family History  Problem Relation Age of Onset   Diabetes Father    CAD Father    CAD Other        MI in his 63's   Cancer Neg Hx    Heart disease Neg Hx    Stroke Neg Hx     Social History Social History[1]   Allergies   Patient has no known allergies.   Review of Systems Review of Systems  Constitutional:  Positive for chills and fever.  HENT:  Positive  for rhinorrhea and voice change. Negative for congestion, postnasal drip, sneezing and sore throat.   Eyes:  Negative for visual disturbance.  Respiratory:  Positive for cough. Negative for shortness of breath and wheezing.   Cardiovascular:  Negative for chest pain, palpitations and leg swelling.  Gastrointestinal:  Negative for nausea and vomiting.  Musculoskeletal:  Negative for myalgias.  Neurological:  Positive for dizziness. Negative for speech difficulty, weakness, numbness and headaches.  All other systems reviewed and are negative.    Physical Exam Triage Vital Signs ED Triage Vitals  Encounter Vitals Group     BP 02/19/24 1914 (!) 153/92     Girls Systolic BP Percentile --      Girls Diastolic BP Percentile --      Boys Systolic BP Percentile --      Boys Diastolic BP Percentile --      Pulse Rate 02/19/24 1914 94     Resp 02/19/24 1914 17     Temp 02/19/24 1914 98.6 F (37 C)     Temp Source 02/19/24 1914 Oral     SpO2 02/19/24 1914 95 %     Weight --      Height --      Head Circumference --      Peak Flow --      Pain Score 02/19/24 1913 0     Pain Loc --      Pain Education --      Exclude from Growth Chart --    No data found.  Updated Vital Signs BP (!) 153/92 (BP Location: Right Arm)   Pulse 94   Temp 98.6 F (37 C) (Oral)   Resp 17   SpO2 95%   Visual Acuity Right Eye Distance:   Left Eye Distance:   Bilateral Distance:    Right Eye Near:   Left Eye Near:    Bilateral Near:     Physical Exam Vitals reviewed.  Constitutional:      General: He is awake. He is not in acute distress.    Appearance: Normal appearance. He is well-developed. He is not ill-appearing, toxic-appearing or diaphoretic.  HENT:     Head: Normocephalic.     Right Ear: Hearing, tympanic membrane, ear canal and external ear normal. No drainage, swelling or tenderness. No middle ear effusion. Tympanic membrane is not erythematous.     Left Ear: Hearing, tympanic membrane,  ear canal and external ear normal. No drainage, swelling or tenderness.  No middle ear effusion. Tympanic membrane is not erythematous.     Nose: Nose normal.     Mouth/Throat:     Lips: Pink.     Mouth: Mucous membranes are moist.     Pharynx: Oropharynx is clear. Uvula midline. No pharyngeal swelling, oropharyngeal exudate, posterior oropharyngeal erythema or uvula swelling.     Tonsils: No tonsillar exudate or tonsillar abscesses.  Eyes:     General: Vision grossly intact.  Extraocular Movements: Extraocular movements intact.     Right eye: Normal extraocular motion and no nystagmus.     Left eye: Normal extraocular motion and no nystagmus.     Conjunctiva/sclera: Conjunctivae normal.     Pupils: Pupils are equal, round, and reactive to light.  Neck:     Trachea: Phonation normal.  Cardiovascular:     Rate and Rhythm: Normal rate and regular rhythm.     Pulses: Normal pulses.     Heart sounds: Normal heart sounds.  Pulmonary:     Effort: Pulmonary effort is normal. No tachypnea or respiratory distress.     Breath sounds: Normal breath sounds and air entry.     Comments: Respirations even and unlabored  Musculoskeletal:        General: Normal range of motion.     Cervical back: Full passive range of motion without pain, normal range of motion and neck supple.     Right lower leg: No edema.     Left lower leg: No edema.  Lymphadenopathy:     Cervical: No cervical adenopathy.  Skin:    General: Skin is warm and dry.  Neurological:     General: No focal deficit present.     Mental Status: He is alert and oriented to person, place, and time.     Sensory: Sensation is intact. No sensory deficit.     Motor: Motor function is intact. No weakness.     Gait: Gait is intact.  Psychiatric:        Speech: Speech normal.        Behavior: Behavior normal. Behavior is cooperative.      UC Treatments / Results  Labs (all labs ordered are listed, but only abnormal results are  displayed) Labs Reviewed - No data to display  EKG   Radiology No results found.  Procedures Procedures (including critical care time)  Medications Ordered in UC Medications - No data to display  Initial Impression / Assessment and Plan / UC Course  I have reviewed the triage vital signs and the nursing notes.  Pertinent labs & imaging results that were available during my care of the patient were reviewed by me and considered in my medical decision making (see chart for details).     The patient presents with a resolved episode of dizziness in the setting of a recent upper respiratory illness and recent use of Mucinex. He is afebrile, nontoxic appearing, and currently asymptomatic. Initial blood pressure was mildly elevated at 153/82 with improvement on repeat to 135/91. No dizziness was present at the time of evaluation, and physical examination revealed no focal neurologic deficits or other concerning findings. Given the timing of symptoms following use of Mucinex and the transient elevation in blood pressure, it is possible that the dizziness was medication-related. There is no clinical evidence at this time to suggest an acute neurologic or cardiopulmonary process.  The patient was advised to discontinue Mucinex and to use Coricidin HBP products for management of URI symptoms given his history of hypertension. Lifestyle recommendations including reducing dietary sodium and monitoring blood pressure closely at home were discussed. The patient was advised to follow up with his primary care provider for ongoing blood pressure management and symptom reassessment. Emergency department precautions were reviewed, including instructions to seek immediate care for recurrent or persistent dizziness, chest pain, shortness of breath, focal weakness, vision changes, or significantly elevated blood pressure.  Today's evaluation has revealed no signs of a dangerous process. Discussed diagnosis  with  patient and/or guardian. Patient and/or guardian aware of their diagnosis, possible red flag symptoms to watch out for and need for close follow up. Patient and/or guardian understands verbal and written discharge instructions. Patient and/or guardian comfortable with plan and disposition.  Patient and/or guardian has a clear mental status at this time, good insight into illness (after discussion and teaching) and has clear judgment to make decisions regarding their care  Documentation was completed with the aid of voice recognition software. Transcription may contain typographical errors.  Final Clinical Impressions(s) / UC Diagnoses   Final diagnoses:  Elevated blood pressure reading with diagnosis of hypertension  Viral upper respiratory tract infection     Discharge Instructions      Your blood pressure was elevated during todays visit but got better on recheck. You had some dizziness earlier this afternoon which has since resolved and you are not experiencing any symptoms at this time. It's possible that your elevated blood pressure was from the cough medicine you've been taking for your respiratory illness. Consider using Coricidin HBP products for your cough/cold symptoms as they do not have ingredients in them that can raise your blood pressure. Reducing your salt intake, exercising regularly, limiting alcohol consumption, and continue to not smoke are all essential in managing your blood pressure and preventing complications. Please monitor your blood pressure regularly, and if you experience any symptoms such as dizziness, chest pain, or severe headaches, seek medical attention immediately. Follow up with your primary care provider to monitor your blood pressure and make any necessary adjustments to your treatment plan.       ED Prescriptions   None    PDMP not reviewed this encounter.     [1]  Social History Tobacco Use   Smoking status: Never   Smokeless tobacco: Never   Substance Use Topics   Alcohol use: Yes    Alcohol/week: 1.0 standard drink of alcohol    Types: 1 Shots of liquor per week    Comment: rare   Drug use: No     Iola Lukes, FNP 02/22/24 1040  "
